# Patient Record
Sex: Female | Born: 1940 | Race: White | Hispanic: No | Marital: Married | Smoking: Never smoker
Health system: Southern US, Community
[De-identification: ages and names within clinical notes are randomized; demographics above are authoritative.]

## PROBLEM LIST (undated history)

## (undated) DIAGNOSIS — I10 Essential (primary) hypertension: Secondary | ICD-10-CM

## (undated) DIAGNOSIS — I454 Nonspecific intraventricular block: Secondary | ICD-10-CM

## (undated) DIAGNOSIS — G510 Bell's palsy: Secondary | ICD-10-CM

## (undated) DIAGNOSIS — G709 Myoneural disorder, unspecified: Secondary | ICD-10-CM

## (undated) DIAGNOSIS — F419 Anxiety disorder, unspecified: Secondary | ICD-10-CM

## (undated) DIAGNOSIS — E785 Hyperlipidemia, unspecified: Secondary | ICD-10-CM

## (undated) HISTORY — PX: LUMBAR FUSION: SHX111

## (undated) HISTORY — DX: Nonspecific intraventricular block: I45.4

## (undated) HISTORY — DX: Bell's palsy: G51.0

## (undated) HISTORY — DX: Hyperlipidemia, unspecified: E78.5

## (undated) HISTORY — PX: EXTERNAL EAR SURGERY: SHX627

## (undated) HISTORY — PX: TUBAL LIGATION: SHX77

## (undated) HISTORY — DX: Anxiety disorder, unspecified: F41.9

---

## 1999-04-04 ENCOUNTER — Other Ambulatory Visit: Admission: RE | Admit: 1999-04-04 | Discharge: 1999-04-04 | Payer: Self-pay | Admitting: Obstetrics and Gynecology

## 1999-09-26 ENCOUNTER — Other Ambulatory Visit: Admission: RE | Admit: 1999-09-26 | Discharge: 1999-09-26 | Payer: Self-pay | Admitting: Obstetrics and Gynecology

## 1999-12-26 ENCOUNTER — Encounter: Payer: Self-pay | Admitting: Family Medicine

## 1999-12-26 ENCOUNTER — Encounter: Admission: RE | Admit: 1999-12-26 | Discharge: 1999-12-26 | Payer: Self-pay | Admitting: Family Medicine

## 2000-01-19 ENCOUNTER — Encounter: Admission: RE | Admit: 2000-01-19 | Discharge: 2000-01-19 | Payer: Self-pay | Admitting: Family Medicine

## 2000-01-19 ENCOUNTER — Encounter: Payer: Self-pay | Admitting: Family Medicine

## 2000-10-31 ENCOUNTER — Other Ambulatory Visit: Admission: RE | Admit: 2000-10-31 | Discharge: 2000-10-31 | Payer: Self-pay | Admitting: Obstetrics and Gynecology

## 2001-01-31 ENCOUNTER — Encounter: Payer: Self-pay | Admitting: Family Medicine

## 2001-01-31 ENCOUNTER — Encounter: Admission: RE | Admit: 2001-01-31 | Discharge: 2001-01-31 | Payer: Self-pay | Admitting: Family Medicine

## 2001-11-12 ENCOUNTER — Other Ambulatory Visit: Admission: RE | Admit: 2001-11-12 | Discharge: 2001-11-12 | Payer: Self-pay | Admitting: Obstetrics and Gynecology

## 2002-02-12 ENCOUNTER — Encounter: Admission: RE | Admit: 2002-02-12 | Discharge: 2002-02-12 | Payer: Self-pay | Admitting: Family Medicine

## 2002-02-12 ENCOUNTER — Encounter: Payer: Self-pay | Admitting: Family Medicine

## 2002-04-07 ENCOUNTER — Ambulatory Visit (HOSPITAL_COMMUNITY): Admission: RE | Admit: 2002-04-07 | Discharge: 2002-04-07 | Payer: Self-pay | Admitting: *Deleted

## 2002-11-26 ENCOUNTER — Other Ambulatory Visit: Admission: RE | Admit: 2002-11-26 | Discharge: 2002-11-26 | Payer: Self-pay | Admitting: Obstetrics and Gynecology

## 2003-01-15 ENCOUNTER — Encounter: Admission: RE | Admit: 2003-01-15 | Discharge: 2003-01-15 | Payer: Self-pay | Admitting: Family Medicine

## 2003-01-15 ENCOUNTER — Encounter: Payer: Self-pay | Admitting: Family Medicine

## 2003-02-09 ENCOUNTER — Ambulatory Visit (HOSPITAL_COMMUNITY): Admission: RE | Admit: 2003-02-09 | Discharge: 2003-02-09 | Payer: Self-pay | Admitting: Orthopaedic Surgery

## 2003-02-19 ENCOUNTER — Encounter: Admission: RE | Admit: 2003-02-19 | Discharge: 2003-02-19 | Payer: Self-pay | Admitting: Family Medicine

## 2003-02-19 ENCOUNTER — Encounter: Payer: Self-pay | Admitting: Family Medicine

## 2004-04-04 ENCOUNTER — Encounter: Admission: RE | Admit: 2004-04-04 | Discharge: 2004-04-04 | Payer: Self-pay | Admitting: Family Medicine

## 2005-01-16 ENCOUNTER — Other Ambulatory Visit: Admission: RE | Admit: 2005-01-16 | Discharge: 2005-01-16 | Payer: Self-pay | Admitting: Obstetrics and Gynecology

## 2005-04-10 ENCOUNTER — Encounter: Admission: RE | Admit: 2005-04-10 | Discharge: 2005-04-10 | Payer: Self-pay | Admitting: Family Medicine

## 2006-05-03 ENCOUNTER — Encounter: Admission: RE | Admit: 2006-05-03 | Discharge: 2006-05-03 | Payer: Self-pay | Admitting: Family Medicine

## 2006-11-23 ENCOUNTER — Encounter: Admission: RE | Admit: 2006-11-23 | Discharge: 2006-11-23 | Payer: Self-pay | Admitting: Family Medicine

## 2007-05-16 ENCOUNTER — Encounter: Admission: RE | Admit: 2007-05-16 | Discharge: 2007-05-16 | Payer: Self-pay | Admitting: Family Medicine

## 2008-06-11 ENCOUNTER — Encounter (INDEPENDENT_AMBULATORY_CARE_PROVIDER_SITE_OTHER): Payer: Self-pay | Admitting: Obstetrics and Gynecology

## 2008-06-11 ENCOUNTER — Ambulatory Visit (HOSPITAL_COMMUNITY): Admission: RE | Admit: 2008-06-11 | Discharge: 2008-06-11 | Payer: Self-pay | Admitting: Obstetrics and Gynecology

## 2008-06-15 ENCOUNTER — Encounter: Admission: RE | Admit: 2008-06-15 | Discharge: 2008-06-15 | Payer: Self-pay | Admitting: Family Medicine

## 2009-06-21 ENCOUNTER — Encounter: Admission: RE | Admit: 2009-06-21 | Discharge: 2009-06-21 | Payer: Self-pay | Admitting: Family Medicine

## 2009-06-25 ENCOUNTER — Encounter: Admission: RE | Admit: 2009-06-25 | Discharge: 2009-06-25 | Payer: Self-pay | Admitting: Family Medicine

## 2010-07-08 ENCOUNTER — Encounter: Admission: RE | Admit: 2010-07-08 | Discharge: 2010-07-08 | Payer: Self-pay | Admitting: Family Medicine

## 2010-10-09 ENCOUNTER — Encounter: Payer: Self-pay | Admitting: Family Medicine

## 2011-01-31 NOTE — Op Note (Signed)
[name], Carol Bolton NO.:  [id number]   MEDICAL RECORD NO.:  [id number]          PATIENT TYPE:  AMB   LOCATION:  SDC                           FACILITY:  [facility]   PHYSICIAN:  Zenaida Niece, M.D.DATE OF BIRTH:  [birth year]   DATE OF PROCEDURE:  06/11/2008  DATE OF DISCHARGE:                               OPERATIVE REPORT   PREOPERATIVE DIAGNOSIS:  Vaginal nodule.   POSTOPERATIVE DIAGNOSIS:  Vaginal nodule.   PROCEDURE:  Removal of vaginal nodule.   SURGEON:  Zenaida Niece, MD   ANESTHESIA:  General with an LMA and local block.   FINDINGS:  She had a small vaginal nodule in the left vaginal fornix.   SPECIMENS:  Vaginal nodule sent to pathology.   ESTIMATED BLOOD LOSS:  Minimal.   COMPLICATIONS:  None.   PROCEDURE IN DETAIL:  The patient was taken to the operating room and  placed in the dorsal supine position.  General anesthesia was induced  and she was placed in mobile stirrups.  Perineum and vagina were then  prepped and draped in the usual sterile fashion and bladder drained with  a latex-free catheter.  Retractors were used anteriorly and posteriorly  and the vaginal nodule was palpated and then visualized in the left  vaginal fornix.  The nodule was grasped with an Allis clamp.  The  underlying vaginal mucosa was infiltrated with 5 mL of 2% plain  lidocaine.  The nodule was then removed sharply.  The resulting mucosal  defect was then closed with running locking 3-0 Vicryl with adequate  closure and adequate hemostasis.  Exam revealed no further nodules and  good removal of this nodule.  All instruments were then removed from the  vagina.  The patient was taken down from stirrups.  She was awakened in  the operating room and taken to the recovery room in stable condition.  Counts were correct x2.      Zenaida Niece, M.D.  Electronically Signed     TDM/MEDQ  D:  06/11/2008  T:  06/11/2008  Job:  [id number]

## 2011-05-30 ENCOUNTER — Other Ambulatory Visit: Payer: Self-pay | Admitting: Otolaryngology

## 2011-05-30 DIAGNOSIS — R05 Cough: Secondary | ICD-10-CM

## 2011-06-09 ENCOUNTER — Ambulatory Visit
Admission: RE | Admit: 2011-06-09 | Discharge: 2011-06-09 | Disposition: A | Discharge status: Home / Self Care | Payer: Medicare Other | Source: Ambulatory Visit | Attending: Otolaryngology | Admitting: Otolaryngology

## 2011-06-09 DIAGNOSIS — R05 Cough: Secondary | ICD-10-CM

## 2011-06-19 LAB — CBC
HCT: 42.2
Hemoglobin: 14.1
Platelets: 378
RBC: 4.81
RDW: 12.6
WBC: 7.7

## 2011-06-19 LAB — BASIC METABOLIC PANEL
Chloride: 102
GFR calc Af Amer: >60
Glucose, Bld: 113 — ABNORMAL HIGH
Sodium: 136

## 2011-06-23 ENCOUNTER — Other Ambulatory Visit: Payer: Self-pay | Admitting: Family Medicine

## 2011-06-23 DIAGNOSIS — Z1231 Encounter for screening mammogram for malignant neoplasm of breast: Secondary | ICD-10-CM

## 2011-07-17 ENCOUNTER — Ambulatory Visit
Admission: RE | Admit: 2011-07-17 | Discharge: 2011-07-17 | Disposition: A | Discharge status: Home / Self Care | Payer: Medicare Other | Source: Ambulatory Visit | Attending: Family Medicine | Admitting: Family Medicine

## 2011-07-17 DIAGNOSIS — Z1231 Encounter for screening mammogram for malignant neoplasm of breast: Secondary | ICD-10-CM

## 2011-08-16 ENCOUNTER — Emergency Department (INDEPENDENT_AMBULATORY_CARE_PROVIDER_SITE_OTHER): Payer: Medicare Other

## 2011-08-16 ENCOUNTER — Emergency Department (HOSPITAL_BASED_OUTPATIENT_CLINIC_OR_DEPARTMENT_OTHER)
Admission: EM | Admit: 2011-08-16 | Discharge: 2011-08-17 | Disposition: A | Discharge status: Home / Self Care | Payer: Medicare Other | Attending: Emergency Medicine | Admitting: Emergency Medicine

## 2011-08-16 ENCOUNTER — Encounter: Payer: Self-pay | Admitting: *Deleted

## 2011-08-16 DIAGNOSIS — M545 Low back pain: Secondary | ICD-10-CM

## 2011-08-16 DIAGNOSIS — M549 Dorsalgia, unspecified: Secondary | ICD-10-CM | POA: Insufficient documentation

## 2011-08-16 DIAGNOSIS — Z79899 Other long term (current) drug therapy: Secondary | ICD-10-CM | POA: Insufficient documentation

## 2011-08-16 DIAGNOSIS — J9819 Other pulmonary collapse: Secondary | ICD-10-CM

## 2011-08-16 DIAGNOSIS — I10 Essential (primary) hypertension: Secondary | ICD-10-CM | POA: Insufficient documentation

## 2011-08-16 DIAGNOSIS — J189 Pneumonia, unspecified organism: Secondary | ICD-10-CM | POA: Insufficient documentation

## 2011-08-16 DIAGNOSIS — R079 Chest pain, unspecified: Secondary | ICD-10-CM

## 2011-08-16 DIAGNOSIS — R002 Palpitations: Secondary | ICD-10-CM | POA: Insufficient documentation

## 2011-08-16 HISTORY — DX: Essential (primary) hypertension: I10

## 2011-08-16 LAB — URINALYSIS, ROUTINE W REFLEX MICROSCOPIC
Bilirubin Urine: NEGATIVE
Glucose, UA: NEGATIVE mg/dL
Hgb urine dipstick: NEGATIVE
Ketones, ur: 15 mg/dL — AB
Specific Gravity, Urine: 1.019 (ref 1.005–1.030)
Urobilinogen, UA: 0.2 mg/dL (ref 0.0–1.0)
pH: 5 (ref 5.0–8.0)

## 2011-08-16 LAB — COMPREHENSIVE METABOLIC PANEL
AST: 15 U/L (ref 0–37)
Albumin: 3.9 g/dL (ref 3.5–5.2)
BUN: 18 mg/dL (ref 6–23)
Calcium: 9.4 mg/dL (ref 8.4–10.5)
Creatinine, Ser: 0.7 mg/dL (ref 0.50–1.10)
GFR calc non Af Amer: 86 mL/min — ABNORMAL LOW (ref >90)
Sodium: 140 mEq/L (ref 135–145)
Total Protein: 7.3 g/dL (ref 6.0–8.3)

## 2011-08-16 LAB — D-DIMER, QUANTITATIVE: D-Dimer, Quant: <0.22 ug/mL-FEU (ref 0.00–0.48)

## 2011-08-16 MED ORDER — DEXTROSE 5 % IV SOLN
1.0000 g | Freq: Once | INTRAVENOUS | Status: AC
  Administered 2011-08-16: 1 g via INTRAVENOUS
  Filled 2011-08-16: qty 10

## 2011-08-16 MED ORDER — HYDROCOD POLST-CHLORPHEN POLST 10-8 MG/5ML PO LQCR
5.0000 mL | Freq: Once | ORAL | Status: AC
  Administered 2011-08-16: 5 mL via ORAL
  Filled 2011-08-16: qty 5

## 2011-08-16 MED ORDER — HYDROCOD POLST-CHLORPHEN POLST 10-8 MG/5ML PO LQCR: 5.0000 mL | Freq: Two times a day (BID) | ORAL | Status: DC

## 2011-08-16 MED ORDER — MOXIFLOXACIN HCL 400 MG PO TABS: 400.0000 mg | ORAL_TABLET | Freq: Every day | ORAL | Status: AC

## 2011-08-16 MED ORDER — SODIUM CHLORIDE 0.9 % IV BOLUS (SEPSIS)
1000.0000 mL | Freq: Once | INTRAVENOUS | Status: AC
  Administered 2011-08-16: 1000 mL via INTRAVENOUS

## 2011-08-16 NOTE — ED Notes (Signed)
Dr Ignacia Palma speaking about test results and f/u care.

## 2011-08-16 NOTE — ED Notes (Signed)
Pt sts she began having low back burning sensation and heart palpitations this afternoon while watching her grandkids. Pt is due to have sinus surgery in the a.m.

## 2011-08-16 NOTE — ED Notes (Signed)
Dr. Davidson at bedside.

## 2011-08-16 NOTE — ED Provider Notes (Signed)
History     CSN: [id number] Arrival date & time: 08/16/2011  9:12 PM   None     Chief Complaint  Patient presents with  . Palpitations    (Consider location/radiation/quality/duration/timing/severity/associated sxs/prior treatment) HPI Comments: Patient is a 70 year old woman who is taking care of her grandchildren this afternoon. Around 4:30 in the afternoon she developed a fluttery feeling in her chest. It was a tightness in her chest in the area of bra line. Her legs felt weak. She's had pain in her back in the past and took some Advil, which didn't seem to help. She has noted a cough recently. She has chronic sinusitis, and is supposed to have sinus surgery tomorrow.  Patient is a 70 y.o. female presenting with palpitations.  Palpitations  This is a new problem. The current episode started 3 to 5 hours ago. The problem occurs constantly. The problem has not changed since onset.The problem is associated with an unknown factor. Episode Length: Fluttery feeling in her chest as a continuous sensation. Associated symptoms include back pain, cough and sputum production. Pertinent negatives include no fever. Chest pain: she notes some chest tightness. Treatments tried: She took Advil without relief.    Past Medical History  Diagnosis Date  . Hypertension     Past Surgical History  Procedure Date  . External ear surgery     No family history on file.  History  Substance Use Topics  . Smoking status: Never Smoker   . Smokeless tobacco: Not on file  . Alcohol Use: No    OB History    Grav Para Term Preterm Abortions TAB SAB Ect Mult Living                  Review of Systems  Constitutional: Negative.  Negative for fever.  HENT: Positive for congestion and rhinorrhea.   Eyes: Negative.   Respiratory: Positive for cough and sputum production.   Cardiovascular: Positive for palpitations. Chest pain: she notes some chest tightness.  Gastrointestinal: Negative.     Genitourinary: Negative.   Musculoskeletal: Positive for back pain.  Neurological: Negative.   Psychiatric/Behavioral: Negative.     Allergies  Review of patient's allergies indicates no known allergies.  Home Medications   Current Outpatient Rx  Name Route Sig Dispense Refill  . LOSARTAN POTASSIUM 100 MG PO TABS Oral Take 125 mg by mouth daily.      Marland Kitchen METOPROLOL TARTRATE 50 MG PO TABS Oral Take 50 mg by mouth 2 (two) times daily.      Marland Kitchen HYDROCOD POLST-CHLORPHEN POLST 10-8 MG/5ML PO LQCR Oral Take 5 mLs by mouth every 12 (twelve) hours. 60 mL 0  . MOXIFLOXACIN HCL 400 MG PO TABS Oral Take 1 tablet (400 mg total) by mouth daily. 7 tablet 0    BP 167/75  Pulse 118  Temp(Src) 98.8 F (37.1 C) (Oral)  Resp 18  Ht 5\' 3"  (1.6 m)  Wt 145 lb (65.772 kg)  BMI 25.69 kg/m2  SpO2 97%  Physical Exam  Constitutional: She is oriented to person, place, and time. She appears well-developed and well-nourished.       Her face appears somewhat flushed. She is a healthy-appearing woman in no distress.  HENT:  Head: Normocephalic and atraumatic.  Right Ear: External ear normal.  Left Ear: External ear normal.  Mouth/Throat: Oropharynx is clear and moist.       Her nasal mucosa is boggy and somewhat swollen.  Eyes: Conjunctivae and EOM are normal. Pupils  are equal, round, and reactive to light. No scleral icterus.  Neck: Normal range of motion. Neck supple. No thyromegaly present.  Cardiovascular: Regular rhythm and normal heart sounds.        He has a resting tachycardia of about 120.  Pulmonary/Chest: Effort normal and breath sounds normal.  Abdominal: Soft. Bowel sounds are normal.  Musculoskeletal: Normal range of motion.       No calf tenderness, no Homans sign.  Lymphadenopathy:    She has no cervical adenopathy.  Neurological: She is alert and oriented to person, place, and time.       No sensory or motor deficit.  Skin: Skin is warm and dry.  Psychiatric: She has a normal mood  and affect. Her behavior is normal.    ED Course  Procedures (including critical care time)  Labs Reviewed  CBC - Abnormal; Notable for the following:    WBC 17.8 (*)    All other components within normal limits  COMPREHENSIVE METABOLIC PANEL - Abnormal; Notable for the following:    Glucose, Bld 136 (*)    GFR calc non Af Amer 86 (*)    All other components within normal limits  CARDIAC PANEL(CRET KIN+CKTOT+MB+TROPI) - Abnormal; Notable for the following:    Total CK 312 (*)    All other components within normal limits  URINALYSIS, ROUTINE W REFLEX MICROSCOPIC - Abnormal; Notable for the following:    Ketones, ur 15 (*)    All other components within normal limits  D-DIMER, QUANTITATIVE  URINE CULTURE  TSH   Dg Chest 2 View  08/16/2011  *RADIOLOGY REPORT*  Clinical Data: Bilateral lower back burning sensation, radiating to the chest; heart palpitations.  CHEST - 2 VIEW  Comparison: None.  Findings: The lungs are well-aerated.  Mild focal airspace opacification within the left lingula raises concern for pneumonia. Bibasilar linear atelectasis is seen.  There is no evidence of pleural effusion or pneumothorax.  The heart is normal in size; the mediastinal contour is within normal limits.  No acute osseous abnormalities are seen.  IMPRESSION: Focal airspace opacification within the left lingula raises concern for pneumonia.  Bibasilar linear atelectasis seen.  Original Report Authenticated By: Tonia Ghent, M.D.    11:52 PM  Date: 08/16/2011  Rate: 117  Rhythm: sinus tachycardia  QRS Axis: normal  Intervals: normal PQRS:  Left atrial hypertrophy; Right bundle branch block.  ST/T Wave abnormalities: normal  Conduction Disutrbances:none  Narrative Interpretation: Abnormal EKG.  Old EKG Reviewed: changes noted--rate is more rapid than on 06/10/2008.  11:52 PM Patient was seen and had physical examination. Laboratory tests were ordered. IV fluids were ordered.  11:52 PM Lab  workup showed a white count of 17,000 a left lingular pneumonia. The patient had been on Avelox recently, and feels that adding affect is better for her than most. We will give her a dose of Rocephin 1 g intravenously and place her on Avelox once a day. She will need to cancel her scheduled sinus surgery for tomorrow. She should follow up with her family physician, Cam Hai, M.D.    IMP:  Community-acquired pneumonia.       Carleene Cooper III, MD 08/16/11 [phone]

## 2011-08-18 LAB — URINE CULTURE: Colony Count: 30000

## 2011-09-18 ENCOUNTER — Ambulatory Visit
Admission: RE | Admit: 2011-09-18 | Discharge: 2011-09-18 | Disposition: A | Discharge status: Home / Self Care | Payer: Medicare Other | Source: Ambulatory Visit | Attending: Family Medicine | Admitting: Family Medicine

## 2011-09-18 ENCOUNTER — Other Ambulatory Visit: Payer: Self-pay | Admitting: Family Medicine

## 2011-09-18 DIAGNOSIS — J189 Pneumonia, unspecified organism: Secondary | ICD-10-CM

## 2011-10-09 ENCOUNTER — Ambulatory Visit
Admission: RE | Admit: 2011-10-09 | Discharge: 2011-10-09 | Disposition: A | Discharge status: Home / Self Care | Payer: Medicare Other | Source: Ambulatory Visit | Attending: Family Medicine | Admitting: Family Medicine

## 2011-10-09 ENCOUNTER — Other Ambulatory Visit: Payer: Self-pay | Admitting: Family Medicine

## 2011-10-09 DIAGNOSIS — J189 Pneumonia, unspecified organism: Secondary | ICD-10-CM

## 2011-10-10 ENCOUNTER — Ambulatory Visit
Admission: RE | Admit: 2011-10-10 | Discharge: 2011-10-10 | Disposition: A | Discharge status: Home / Self Care | Payer: Medicare Other | Source: Ambulatory Visit | Attending: Family Medicine | Admitting: Family Medicine

## 2011-10-10 ENCOUNTER — Other Ambulatory Visit: Payer: Self-pay | Admitting: Family Medicine

## 2011-10-10 DIAGNOSIS — J189 Pneumonia, unspecified organism: Secondary | ICD-10-CM

## 2011-10-10 MED ORDER — IOHEXOL 300 MG/ML  SOLN
75.0000 mL | Freq: Once | INTRAMUSCULAR | Status: AC | PRN
  Administered 2011-10-10: 75 mL via INTRAVENOUS

## 2011-11-13 ENCOUNTER — Institutional Professional Consult (permissible substitution): Payer: Medicare Other | Admitting: Pulmonary Disease

## 2011-11-14 ENCOUNTER — Encounter: Payer: Self-pay | Admitting: Pulmonary Disease

## 2011-11-14 DIAGNOSIS — E119 Type 2 diabetes mellitus without complications: Secondary | ICD-10-CM | POA: Insufficient documentation

## 2011-11-14 DIAGNOSIS — E785 Hyperlipidemia, unspecified: Secondary | ICD-10-CM | POA: Insufficient documentation

## 2011-11-14 DIAGNOSIS — I10 Essential (primary) hypertension: Secondary | ICD-10-CM | POA: Insufficient documentation

## 2011-11-14 DIAGNOSIS — F419 Anxiety disorder, unspecified: Secondary | ICD-10-CM | POA: Insufficient documentation

## 2011-11-14 DIAGNOSIS — I454 Nonspecific intraventricular block: Secondary | ICD-10-CM

## 2011-11-14 HISTORY — DX: Type 2 diabetes mellitus without complications: E11.9

## 2011-11-15 ENCOUNTER — Ambulatory Visit (INDEPENDENT_AMBULATORY_CARE_PROVIDER_SITE_OTHER): Payer: Medicare Other | Admitting: Pulmonary Disease

## 2011-11-15 ENCOUNTER — Encounter: Payer: Self-pay | Admitting: Pulmonary Disease

## 2011-11-15 DIAGNOSIS — J479 Bronchiectasis, uncomplicated: Secondary | ICD-10-CM | POA: Insufficient documentation

## 2011-11-15 DIAGNOSIS — R918 Other nonspecific abnormal finding of lung field: Secondary | ICD-10-CM

## 2011-11-15 DIAGNOSIS — J329 Chronic sinusitis, unspecified: Secondary | ICD-10-CM | POA: Insufficient documentation

## 2011-11-15 NOTE — Progress Notes (Signed)
  Subjective:    Patient ID: Carol Bolton, female    DOB: Jul 27, 1941, 71 y.o.   MRN: [id number]  HPI The patient is a 71 year old female who been asked to see for recurrent sinopulmonary infections.  The patient states that she began to have issues with recurrent infections in December of 2011, and ultimately was diagnosed with chronic sinusitis by CT chest.  She was scheduled to have surgery, but this was canceled because of development of pneumonia in November of last year.  At that time, she was having cough with purulent mucus, but denied any chest congestion or fever.  A chest x-ray showed a lingular and lower lobe infiltrates.  The patient was treated with antibiotics, and she feels better each time that she takes them.  However, her chest x-ray has never cleared.  Since November of last year, she has had a recurrence of her worsening cough and purulent mucus that has required multiple rounds of antibiotics.  However, she denies having any pulmonary symptoms such as increased shortness of breath, true wheezing, or chest congestion.  Her cough is primarily from her upper airway, and she notes significant postnasal drip.  I have looked at her prior cxr's, and unfortunately she does not have any in the system before November of 2012.  She has had a recent CT chest which shows inflammatory changes and possibly infiltrate in the lingula, right lower lobe, and left lower lobe.  However, she appears to have dilated bronchi in these areas that are suggestive of bronchiectasis.   Review of Systems  Constitutional: Positive for unexpected weight change. Negative for fever.  HENT: Positive for ear pain and congestion. Negative for nosebleeds, sore throat, rhinorrhea, sneezing, trouble swallowing, dental problem, postnasal drip and sinus pressure.   Eyes: Negative for redness and itching.  Respiratory: Negative for chest tightness, shortness of breath and wheezing.   Cardiovascular: Negative for palpitations  and leg swelling.  Gastrointestinal: Negative for nausea and vomiting.  Genitourinary: Negative for dysuria.  Musculoskeletal: Negative for joint swelling.  Skin: Negative for rash.  Neurological: Negative for headaches.  Hematological: Does not bruise/bleed easily.  Psychiatric/Behavioral: Negative for dysphoric mood. The patient is not nervous/anxious.        Objective:   Physical Exam Constitutional:  Well developed, no acute distress  HENT:  Nares patent without discharge, erythematous mucosa  Oropharynx without exudate, palate and uvula are normal  +facial droop  Eyes:  Perrla, eomi, no scleral icterus  Neck:  No JVD, no TMG  Cardiovascular:  Normal rate, regular rhythm, no rubs or gallops.  No murmurs        Intact distal pulses  Pulmonary :  Normal breath sounds, no stridor or respiratory distress   No rales, rhonchi, or wheezing  Abdominal:  Soft, nondistended, bowel sounds present.  No tenderness noted.   Musculoskeletal:  No lower extremity edema noted.  Lymph Nodes:  No cervical lymphadenopathy noted  Skin:  No cyanosis noted  Neurologic:  Alert, appropriate, moves all 4 extremities without obvious deficit.         Assessment & Plan:

## 2011-11-15 NOTE — Patient Instructions (Signed)
Would call Dr. Allene Pyo office and get sinus surgery rescheduled asap Would like to see you back about 4-6 weeks after the surgery to check on your symptoms.

## 2011-11-15 NOTE — Assessment & Plan Note (Signed)
The patient has persistent abnormalities on a recent CT chest, but I am thinking that these are more inflammatory than anything else.  I am suspicious she has underlying bronchiectasis that may be contributing to the slow clearing.  Unfortunately, she has no old chest x-ray before November of 2012 in the system, and I wonder if her current changes are chronic and would be found on older films as well.

## 2011-11-15 NOTE — Assessment & Plan Note (Signed)
The patient is having recurrent infections off antibiotics that are due to her chronic sinusitis until proven otherwise.  I think she must have her surgery as soon as possible to alleviate this cycle, and would then follow her closely thereafter.  She really is not having any pulmonary symptoms, and most of her issues are coming from the upper airway despite the findings on CT chest.

## 2011-12-07 ENCOUNTER — Other Ambulatory Visit: Payer: Self-pay | Admitting: Otolaryngology

## 2011-12-25 ENCOUNTER — Ambulatory Visit: Payer: Medicare Other | Admitting: Pulmonary Disease

## 2012-01-08 ENCOUNTER — Encounter: Payer: Self-pay | Admitting: Pulmonary Disease

## 2012-01-08 ENCOUNTER — Ambulatory Visit (INDEPENDENT_AMBULATORY_CARE_PROVIDER_SITE_OTHER): Payer: Medicare Other | Admitting: Pulmonary Disease

## 2012-01-08 DIAGNOSIS — J329 Chronic sinusitis, unspecified: Secondary | ICD-10-CM

## 2012-01-08 DIAGNOSIS — J479 Bronchiectasis, uncomplicated: Secondary | ICD-10-CM

## 2012-01-08 NOTE — Assessment & Plan Note (Signed)
It is unclear whether she is going to have recurring pulmonary issues since she has had her sinus surgery, but she is to call me if her cough doesn't totally resolve or if she develops increasing pulmonary symptoms.  At that point, I would culture her lower respiratory tract to establish her colonizing flora, and also do PFTs to evaluate for airflow obstruction which is common in bronchiectasis patients.

## 2012-01-08 NOTE — Progress Notes (Signed)
  Subjective:    Patient ID: Carol Bolton, female    DOB: Aug 27, 1941, 71 y.o.   MRN: [id number]  HPI The patient comes in today for followup of her known abnormal CT chest and recurrent pulmonary symptoms.  She has known chronic sinusitis, and did recently have her surgery.  Since that time, her cough has significantly improved, and continues to do so.  She has not had any chest congestion or mucus from her chest.  She denies any shortness of breath.   Review of Systems  Constitutional: Negative for fever and unexpected weight change.  HENT: Positive for congestion. Negative for ear pain, nosebleeds, sore throat, rhinorrhea, sneezing, trouble swallowing, dental problem, postnasal drip and sinus pressure.   Eyes: Negative for redness and itching.  Respiratory: Positive for cough. Negative for chest tightness, shortness of breath and wheezing.   Cardiovascular: Negative for palpitations and leg swelling.  Gastrointestinal: Negative for nausea and vomiting.  Genitourinary: Negative for dysuria.  Musculoskeletal: Negative for joint swelling.  Skin: Negative for rash.  Neurological: Negative for headaches.  Hematological: Does not bruise/bleed easily.  Psychiatric/Behavioral: Negative for dysphoric mood. The patient is not nervous/anxious.        Objective:   Physical Exam Well-developed female in no acute distress Nose without purulence or discharge noted Chest totally clear to auscultation Cardiac exam with regular rate and rhythm Lower extremities without edema, no cyanosis       Assessment & Plan:

## 2012-01-08 NOTE — Patient Instructions (Signed)
Continue nasal hygiene If your cough persists despite clear sinuses, or if your breathing/congestion becomes a problem, please call for an apptm.

## 2012-01-08 NOTE — Assessment & Plan Note (Signed)
The patient states she is much improved since her surgery for her chronic sinusitis.  Her cough has nearly resolved.

## 2012-03-13 ENCOUNTER — Ambulatory Visit (INDEPENDENT_AMBULATORY_CARE_PROVIDER_SITE_OTHER)
Admission: RE | Admit: 2012-03-13 | Discharge: 2012-03-13 | Disposition: A | Discharge status: Home / Self Care | Payer: Medicare Other | Source: Ambulatory Visit | Attending: Pulmonary Disease | Admitting: Pulmonary Disease

## 2012-03-13 ENCOUNTER — Encounter: Payer: Self-pay | Admitting: Pulmonary Disease

## 2012-03-13 ENCOUNTER — Ambulatory Visit (INDEPENDENT_AMBULATORY_CARE_PROVIDER_SITE_OTHER): Payer: Medicare Other | Admitting: Pulmonary Disease

## 2012-03-13 VITALS — BP 156/88 | HR 81 | Temp 98.0°F | Ht 62.5 in | Wt 132.0 lb

## 2012-03-13 DIAGNOSIS — J479 Bronchiectasis, uncomplicated: Secondary | ICD-10-CM

## 2012-03-13 DIAGNOSIS — R059 Cough, unspecified: Secondary | ICD-10-CM

## 2012-03-13 DIAGNOSIS — R05 Cough: Secondary | ICD-10-CM | POA: Insufficient documentation

## 2012-03-13 HISTORY — DX: Cough, unspecified: R05.9

## 2012-03-13 MED ORDER — HYDROCODONE-HOMATROPINE 5-1.5 MG/5ML PO SYRP: ORAL_SOLUTION | ORAL | Status: DC

## 2012-03-13 NOTE — Progress Notes (Signed)
  Subjective:    Patient ID: Carol Bolton, female    DOB: Dec 10, 1940, 71 y.o.   MRN: [id number]  HPI Patient comes in today for an acute sick visit.  She was last seen for chronic cough, and found to have chronic sinusitis as well as bronchiectasis.  She underwent sinus surgery, with significant improvement in her cough.  Since that time, she has had a recurrence of her cough, and has recently seen her otolaryngologist who felt her sinuses were clear.  He did not reimage her sinuses.  She denies chest congestion or shortness of breath, but does have severe postnasal drip leading to her cough.  Her ENT doctor has treated her with multiple nasal sprays without success.  Of note, the patient states that she had no pulmonary symptoms when she was diagnosed with "pneumonia" in the past.   Review of Systems  Constitutional: Negative for fever and unexpected weight change.  HENT: Positive for congestion, rhinorrhea and postnasal drip. Negative for ear pain, nosebleeds, sore throat, sneezing, trouble swallowing and dental problem.   Eyes: Negative for redness and itching.  Respiratory: Positive for cough. Negative for chest tightness, shortness of breath and wheezing.   Cardiovascular: Negative for palpitations and leg swelling.  Gastrointestinal: Negative for nausea and vomiting.  Genitourinary: Negative for dysuria.  Musculoskeletal: Negative for joint swelling.  Skin: Negative for rash.  Neurological: Negative for headaches.  Hematological: Does not bruise/bleed easily.  Psychiatric/Behavioral: Negative for dysphoric mood. The patient is not nervous/anxious.        Objective:   Physical Exam Well-developed female in no acute distress Nose without purulence or discharge noted Oropharynx clear Chest with a squeak and slight crackle in the right base, otherwise clear Cardiac exam with regular rate and rhythm Lower extremities without edema, no cyanosis Alert and oriented, moves all 4  extremities.       Assessment & Plan:

## 2012-03-13 NOTE — Patient Instructions (Addendum)
Will check cxr today, and call you with results. Will need to check a sputum culture to identify your colonizing organisms with your bronchiectasis. Chlorpheniramine 4mg   Take 2 at bedtime and one at lunch each day.  Stop zyrtec and benedryl. Hycodan one teaspoon at bedtime if needed for cough.  Can take mucinex during the day. Please call in 2 weeks (before 7/10) with how things are going with new antihistamine.

## 2012-03-13 NOTE — Assessment & Plan Note (Addendum)
The patient's cough had totally resolved after her sinus surgery, but now has returned.  She continues to have nasal symptoms and postnasal drip, but apparently has been told that her sinuses were clear by otolaryngology.  She really is not having pulmonary symptoms, but is concerned he posed her prior episodes of infection were not associated with definite pulmonary symptoms.  Her spirometry today is totally normal, but we'll check a chest x-ray for completeness.  I also would like to culture her mucus with her underlying bronchiectasis to make sure she does not have a resistant pathogen or possibly MAC.  Will treat her with a cough suppressant until we can sort this out.  Would also like to treat with a more aggressive antihistamine to see if we can improve the postnasal drip.

## 2012-03-13 NOTE — Assessment & Plan Note (Signed)
The patient has bronchiectasis on prior scans, but it is unclear whether any of her current symptoms are associated with this.  Her description of cough and associated symptoms are most consistent with an upper airway cough.

## 2012-03-15 ENCOUNTER — Other Ambulatory Visit: Payer: Medicare Other

## 2012-03-15 ENCOUNTER — Telehealth: Payer: Self-pay | Admitting: Pulmonary Disease

## 2012-03-15 DIAGNOSIS — J479 Bronchiectasis, uncomplicated: Secondary | ICD-10-CM

## 2012-03-15 NOTE — Telephone Encounter (Signed)
Notes Recorded by Barbaraann Share, MD on 03/13/2012 at 6:17 PM Please let pt know that her cxr shows no pna.  ----  Called, spoke with pt.  I informed her CXR shows no pna per Dr. Shelle Iron.  She verbalized understanding of this.  Also, pt states Chlorpheniramine was not sent to pharmacy.  Advised this is OTC.  Pt verbalized understanding of this and voiced no further questions/concerns at this time.

## 2012-03-15 NOTE — Progress Notes (Signed)
Quick Note:  Spoke with pt. Advised cxr shows no pna per Dr. Shelle Iron. She verbalized understanding of this. ______

## 2012-03-22 ENCOUNTER — Telehealth: Payer: Self-pay | Admitting: Pulmonary Disease

## 2012-03-22 NOTE — Telephone Encounter (Signed)
Notes Recorded by Barbaraann Share, MD on 03/19/2012 at 1:50 PM Please let pt know that her culture did not grow any unusual bugs. Good news! Let us know early next week (before 7/10), how things are going with her cough   I spoke with patient about results and she verbalized understanding and had no questions

## 2012-03-27 ENCOUNTER — Telehealth: Payer: Self-pay | Admitting: Pulmonary Disease

## 2012-03-27 DIAGNOSIS — J329 Chronic sinusitis, unspecified: Secondary | ICD-10-CM

## 2012-03-27 DIAGNOSIS — R05 Cough: Secondary | ICD-10-CM

## 2012-03-27 NOTE — Telephone Encounter (Signed)
Pt was seen on 03/13/12 and was advised:  Patient Instructions     Will check cxr today, and call you with results.  Will need to check a sputum culture to identify your colonizing organisms with your bronchiectasis.  Chlorpheniramine 4mg  Take 2 at bedtime and one at lunch each day. Stop zyrtec and benedryl.  Hycodan one teaspoon at bedtime if needed for cough. Can take mucinex during the day.  Please call in 2 weeks (before 7/10) with how things are going with new antihistamine.     ---  Called, spoke with pt who is calling to give [facility] update on cough.  Reports this is unchanged - still coughing - prod at times with pale yellow mucus, having congestion in throat, and runny nose.  States she is following all recs above with the chlorpheniramine, hycodan, and mucinex during the day.  She is able to sleep at bedtime when taking 2 chlorpheniramine and hycodan.  She would like to know what the next step is.  She is aware [facility] if off until next Thursday and ok with.  Dr. Shelle Iron, pls advise. Thank you.

## 2012-03-28 LAB — RESPIRATORY CULTURE OR RESPIRATORY AND SPUTUM CULTURE: Gram Stain: NONE SEEN

## 2012-04-04 NOTE — Telephone Encounter (Signed)
Let her know that I am still concerned about her sinuses as the cause of her cough.  I think we should re-image her sinuses and put this issue to rest.  If her sinuses are clean, then it will become even more important to get her sputum cultured to help with antibiotic selection.  Lets start with sinus scan.  Let me know if she is agreeable.

## 2012-04-05 NOTE — Telephone Encounter (Signed)
Spoke with patient-agrees to have CT sinus done. Please advise that DX code to use for this.    Pt would like to be called on her cell phone with appt time and date for CT. Cell: [phone]

## 2012-04-05 NOTE — Telephone Encounter (Signed)
Do the limited ct sinuses. Code:  Chronic cough, chronic sinus disease.

## 2012-04-05 NOTE — Telephone Encounter (Signed)
LMTCB

## 2012-04-10 ENCOUNTER — Ambulatory Visit (INDEPENDENT_AMBULATORY_CARE_PROVIDER_SITE_OTHER)
Admission: RE | Admit: 2012-04-10 | Discharge: 2012-04-10 | Disposition: A | Discharge status: Home / Self Care | Payer: Medicare Other | Source: Ambulatory Visit | Attending: Pulmonary Disease | Admitting: Pulmonary Disease

## 2012-04-10 DIAGNOSIS — R05 Cough: Secondary | ICD-10-CM

## 2012-04-10 DIAGNOSIS — J329 Chronic sinusitis, unspecified: Secondary | ICD-10-CM

## 2012-04-18 ENCOUNTER — Telehealth: Payer: Self-pay | Admitting: Pulmonary Disease

## 2012-04-18 NOTE — Telephone Encounter (Signed)
Called and spoke with patient.  Informed her of results per Dr. Shelle Iron as listed below. Patient verbalized understanding.  Stated she would call ENT and make appt now and once seen by ENT would give Dr. Shelle Iron a call back.  Nothing further needed at this time.  Notes Recorded by Barbaraann Share, MD on 04/17/2012 at 5:00 PM Let pt know that I just got back in town and reviewed her sinus ct. She has diffuse inflammation in all of her sinuses, but it is unclear how much of this is due to infection vs postop changes. Because her scan is far from normal, I would like for her to see ENT again and get them to review the scan for their opinion. Her cough went away when her sinuses were treated with surgery last go around, and it stands to reason this is the cause again given her scan of sinuses. . Let me know what is decided after seeing ENT.

## 2012-04-27 LAB — AFB CULTURE WITH SMEAR (NOT AT ARMC): Acid Fast Smear: NONE SEEN

## 2012-06-05 ENCOUNTER — Telehealth: Payer: Self-pay | Admitting: Pulmonary Disease

## 2012-06-05 NOTE — Telephone Encounter (Signed)
lmomtcb x1--lori looked and id not have any fax on pt

## 2012-06-06 ENCOUNTER — Encounter: Payer: Self-pay | Admitting: Pulmonary Disease

## 2012-06-06 NOTE — Telephone Encounter (Signed)
lmomtcb x2 for pt 

## 2012-06-06 NOTE — Telephone Encounter (Signed)
Pt returned call. Carol Bolton  

## 2012-06-06 NOTE — Telephone Encounter (Signed)
Carol Bolton, they were supposed to have faxed a form, have you seen this? Please advise thanks!!

## 2012-06-06 NOTE — Telephone Encounter (Signed)
Form received and has been placed in [facility] look at folder.

## 2012-06-06 NOTE — Telephone Encounter (Signed)
The pt has normal breathing studies and no significant risk for surgery. If she needs a form filled out, will need to have ortho send over.

## 2012-06-06 NOTE — Telephone Encounter (Signed)
I spoke with pt and she stated she is going to have back surgery. She has a cysts that needs to be removed. Dr. Darden Amber from [facility] ortho will be doing the surgery. It is not scheduled yet. She is needing clearance from Dr. Shelle Iron for this first. Does pt need to come in for OV. Please advise Dr. Shelle Iron thanks

## 2012-06-10 NOTE — Telephone Encounter (Signed)
Pt called back re: same. Carol Bolton °

## 2012-06-11 NOTE — Telephone Encounter (Signed)
Pt aware form has been completed and faxed.

## 2012-06-11 NOTE — Telephone Encounter (Signed)
LMOMTCB x1 for the pt. Surgical clearance form filled out by [facility] and has been faxed back to ortho.

## 2012-06-24 DIAGNOSIS — M7138 Other bursal cyst, other site: Secondary | ICD-10-CM | POA: Diagnosis present

## 2012-06-24 HISTORY — DX: Other bursal cyst, other site: M71.38

## 2012-06-24 NOTE — H&P (Signed)
Carol Bolton 06/24/2012 11:29 AM Location: [facility] Patient #: [id number] DOB: 11/10/1940 Unknown / Language: [place] / Race: White Female   History of Present Illness(Lori Zipporah Plants; 06/24/2012 11:30 AM) The patient is a 71 year old female who comes in today for a preoperative History and Physical. The patient is scheduled for a TLIF L5-S1 to be performed by Dr. Debria Garret D. Shon Baton, MD at [facility] on [phone] @730am  .    Allergies(Lori W Randa Lynn; 06/24/2012 11:30 AM) No Known Drug Allergies. 11/07/2011   Family History(Lynessa Almanzar J Asaf Elmquist, PA-C; 06/29/2012 9:38 AM) Cerebrovascular Accident. grandfather fathers side Heart Disease. grandmother mothers side Hypertension. father   Social History(Alethia Melendrez J [facility], PA-C; 06/29/2012 9:38 AM) Pain Contract. no Number of flights of stairs before winded. greater than 5, 4-5 Marital status. married Exercise. Exercises weekly; does running / walking Alcohol use. never consumed alcohol Drug/Alcohol Rehab (Previously). no Drug/Alcohol Rehab (Currently). no Tobacco use. never smoker Children. 2 Current work status. retired Illicit drug use. no Living situation. live with spouse   Medication History(Lori W Randa Lynn; 06/24/2012 11:32 AM) Losartan Potassium-HCTZ (100-25MG  Tablet, 1 Oral every morning) Active. Simvastatin (40MG  Tablet, 1 Oral at bedtime) Active. Metoprolol Tartrate (50MG  Tablet, 1 Oral two times daily) Active. Aspirin (81MG  Tablet, 1 Oral every morning) Active. Fish Oil Concentrate (435MG  Capsule, 1 Oral every morning) Active. Vitamin D (1 Oral) Specific dose unknown - Active. Vitamin C (1 Oral) Specific dose unknown - Active.   Pregnancy / Birth History(Lori Zipporah Plants; 06/24/2012 11:33 AM) Pregnant. no   Past Surgical History(Shenequa Howse J [facility], PA-C; 06/29/2012 9:38 AM) Tubal Ligation Sinus Surgery   Other Problems(Taeler Winning J [facility], PA-C; 06/29/2012 9:38 AM) Diabetes Mellitus, Type  II High blood pressure Hypercholesterolemia   Review of Systems(Sholanda Croson J [facility], PA-C; 06/29/2012 9:38 AM) General:Not Present- Chills, Fever, Night Sweats, Appetite Loss, Fatigue, Feeling sick, Weight Gain and Weight Loss. Skin:Not Present- Itching, Rash, Skin Color Changes, Ulcer, Psoriasis and Change in Hair or Nails. HEENT:Not Present- Sensitivity to light, Hearing problems, Nose Bleed and Ringing in the Ears. Neck:Not Present- Swollen Glands and Neck Mass. Respiratory:Not Present- Snoring, Chronic Cough, Bloody sputum and Dyspnea. Cardiovascular:Not Present- Shortness of Breath, Chest Pain, Swelling of Extremities, Leg Cramps and Palpitations. Gastrointestinal:Not Present- Bloody Stool, Heartburn, Abdominal Pain, Vomiting, Nausea and Incontinence of Stool. Female Genitourinary:Not Present- Blood in Urine, Menstrual Irregularities, Frequency, Incontinence and Nocturia. Musculoskeletal:Not Present- Muscle Weakness, Muscle Pain, Joint Stiffness, Joint Swelling, Joint Pain and Back Pain. Neurological:Present- Tingling and Numbness. Not Present- Burning, Tremor, Headaches and Dizziness. Psychiatric:Not Present- Anxiety, Depression and Memory Loss. Endocrine:Not Present- Cold Intolerance, Heat Intolerance, Excessive hunger and Excessive Thirst. Hematology:Not Present- Abnormal Bleeding, Anemia, Blood Clots and Easy Bruising.   Vitals(Lori W Lamb; 06/24/2012 11:34 AM) 06/24/2012 11:33 AM Weight: 133 lb Height: 62.5 in Body Surface Area: 1.63 m Body Mass Index: 23.94 kg/m Pulse: 62 (Regular) BP: 123/72 (Sitting, Left Arm, Standard)    Physical Exam(Mateen Franssen J Mirtie Bastyr, PA-C; 06/29/2012 9:56 AM) The physical exam findings are as follows:  Note: She is alert and oriented in NAD. She ambulates without assistance. Heart and lungs are clear to auscultation. The abdomen is soft and without rebound tenderness. PERRL. Neck is supple and without lymphadenopathy.  There is no obvious deformity of the lumbar spine. She has decreased and painful ROM. She is able to heel-walk, toe-walk and heel-toe-walk without difficulty. Sensation and peripheral pulses are intact. DTR are symmetric. Negative Babinski. No clonus. From a seated position she has slightly decreased strength on  the right compared to the left however she does have right sided radicular leg pain. No true hip, knee or ankle pain with joint ROM. Compartments are soft and nontender.   Assessment & Plan(Gloristine Turrubiates J [facility], PA-C; 06/29/2012 9:57 AM) Pain, Lumbar (LBP) (724.2)  Note: Unfortunately conservative measures consisting of observation, activity modification, injections and oral pain medications have failed to alleviate her symptoms and given the ongoing nature of her pain and the significant decrease in her overall quality of life, she wishes to proceed with surgery. Risks/benefits/alternatives to the procedure/expectations following the procedure have been reviewed with the patient by Dr. Shon Baton. She is aware that the goal of surgery is to reduce not eliminate her pain. She understands.  MRI shows a large right-sided synovial cyst displacing the right S1 and S2 roots; it has not decreased in size from previous study. There is also a smaller left L5-S1 cyst, but it is not causing significant nerve compression. Please see the MRI report for the remaining specifics.   She has been fitted for her brace and knows to bring this with her the morning of surgery. She has completed her pro-op hospital requirements. She has been medically cleared for the procedure by Dr. Clelia Croft and Dr. Shelle Iron. Please see the scanned documentation in the patients office chart.   Signed electronically by Gwinda Maine, PA-C (06/29/2012 9:57 AM)    Carol Bolton 06/03/2012 9:01 AM [facility]: [facility] Patient #: [id number] DOB: Oct 20, 1940 Unknown / Language: [place] / Race:  White Female   History of Present Illness(Lori Zipporah Plants; 06/03/2012 9:17 AM) The patient is a 71 year old female who presents today for follow up of their back. The patient is being followed for their back pain. Symptoms reported today include: pain (about the right lower lumbar region radiating into the right lower ext. to the foot ), swelling (yesterday some right ankle swelling ) and numbness (right lower ext. ), while the patient does not report symptoms of: weakness or urinary incontinence. The patient states that they are doing poorly. Current treatment includes: relative rest, activity modification and NSAIDs. The following medication has been used for pain control: antiinflammatory medication (Advil ). The patient presents today following MRI (performed at [facility] ).    Subjective Transcription(DAHARI Sheela Stack, MD; 06/06/2012 5:16 PM)  She returns today for follow up. She continues to have severe right radicular S1 nerve pain, no loss of control of bowel or bladder, no significant left leg pain.    Allergies(Lori W Lamb; 06/03/2012 9:02 AM) No Known Drug Allergies. 11/07/2011   Social History(Lori W Randa Lynn; 06/03/2012 9:02 AM) Tobacco use. never smoker   Past Surgical History(Lori W Lamb; 06/03/2012 9:02 AM) Tubal Ligation Sinus Surgery   Objective Transcription(DAHARI D BROOKS, MD; 06/06/2012 5:16 PM)  Her clinical exam is still significant for horrific back pain with radicular leg pain. The MRI shows a large right-sided synovial cyst that has not decreased in size. There is also a smaller left L5-S1 cyst, but it is not causing significant nerve compression. At this point in time, the patient has tried an aspiration and injection which were unsuccessful. In order to address this, I would need to do a complete Gill decompression on the right side to eliminate the facet joint and the cyst and thereby prevent a recurrence of the cyst. My concern is that, if I just do  the right side, the cyst on the left can become problematic and then I'd have to go back in and  address that, therefore she would need a partial facetectomy on the left and a complete facetectomy on the right. This would result in instability, therefore, in order to prevent iatrogenic instability, I would recommend, in addition to the decompression, a transforaminal lumbar interbody fusion. Through the same Gill decompression, I can put in an interbody spacer and pedicle screw fixation to increase my likelihood of solid fusion. I have discussed this with the patient including the risks and the benefits which include infection, bleeding, nerve damage, death, stroke, paralysis, failure to heal, hardware failure, nonunion, leak of spinal fluid, loss of bowel and bladder control. All of her questions were addressed.    Plans Transcription(DAHARI Sheela Stack, MD; 06/06/2012 5:16 PM)  I have given her some literature to review. She will discuss it with her family and then she will contact me this week and we will determine how best to proceed.      Miscellaneous Transcription(DAHARI Sheela Stack, MD; 06/06/2012 5:16 PM)  Venita Lick, M. D./slk    T: 06-05-12  D: 06-03-12      Signed electronically by Alvy Beal, MD (06/04/2012 5:06 PM)

## 2012-06-25 ENCOUNTER — Encounter (HOSPITAL_COMMUNITY): Payer: Self-pay | Admitting: Pharmacy Technician

## 2012-06-27 ENCOUNTER — Encounter (HOSPITAL_COMMUNITY): Payer: Self-pay

## 2012-06-27 ENCOUNTER — Encounter (HOSPITAL_COMMUNITY)
Admission: RE | Admit: 2012-06-27 | Discharge: 2012-06-27 | Disposition: A | Discharge status: Home / Self Care | Payer: Medicare Other | Source: Ambulatory Visit | Attending: Physician Assistant | Admitting: Physician Assistant

## 2012-06-27 ENCOUNTER — Encounter (HOSPITAL_COMMUNITY)
Admission: RE | Admit: 2012-06-27 | Discharge: 2012-06-27 | Disposition: A | Discharge status: Home / Self Care | Payer: Medicare Other | Source: Ambulatory Visit | Attending: Orthopedic Surgery | Admitting: Orthopedic Surgery

## 2012-06-27 HISTORY — DX: Myoneural disorder, unspecified: G70.9

## 2012-06-27 LAB — CBC
HCT: 38.6 % (ref 36.0–46.0)
Hemoglobin: 12.8 g/dL (ref 12.0–15.0)
MCH: 28.6 pg (ref 26.0–34.0)
MCHC: 33.2 g/dL (ref 30.0–36.0)

## 2012-06-27 LAB — COMPREHENSIVE METABOLIC PANEL
ALT: 13 U/L (ref 0–35)
Alkaline Phosphatase: 76 U/L (ref 39–117)
CO2: 28 mEq/L (ref 19–32)
GFR calc Af Amer: >90 mL/min (ref >90)
GFR calc non Af Amer: 84 mL/min — ABNORMAL LOW (ref >90)
Glucose, Bld: 112 mg/dL — ABNORMAL HIGH (ref 70–99)
Potassium: 4 mEq/L (ref 3.5–5.1)
Sodium: 141 mEq/L (ref 135–145)
Total Bilirubin: 0.1 mg/dL — ABNORMAL LOW (ref 0.3–1.2)

## 2012-06-27 NOTE — Pre-Procedure Instructions (Signed)
20 NOVICE VRBA  06/27/2012   Your procedure is scheduled on:  Thursday Jun 24, 2012  Report to [facility] at 0530 AM.  Call this number if you have problems the morning of surgery: [phone]   Remember:   Do not eat foodor drink liquids :After Midnight 07/03/2012.   Take these medicines the morning of surgery with A SIP OF WATER: cipro  Lopressor  claritin flonase     Do not wear jewelry, make-up or nail polish.  Do not wear lotions, powders, or perfumes. You may wear deodorant.  Do not shave 48 hours prior to surgery. Men may shave face and neck.  Do not bring valuables to the hospital.  Contacts, dentures or bridgework may not be worn into surgery.  Leave suitcase in the car. After surgery it may be brought to your room.  For patients admitted to the hospital, checkout time is 11:00 AM the day of discharge.   Patients discharged the day of surgery will not be allowed to drive home.  Name and phone number of your driver:   Special Instructions: Shower using CHG 2 nights before surgery and the night before surgery.  If you shower the day of surgery use CHG.  Use special wash - you have one bottle of CHG for all showers.  You should use approximately 1/3 of the bottle for each shower.   Please read over the following fact sheets that you were given: Pain Booklet, Coughing and Deep Breathing, Blood Transfusion Information, MRSA Information and Surgical Site Infection Prevention

## 2012-07-03 MED ORDER — CEFAZOLIN SODIUM-DEXTROSE 2-3 GM-% IV SOLR
2.0000 g | INTRAVENOUS | Status: AC
  Administered 2012-07-04: 2 g via INTRAVENOUS
  Administered 2012-07-04: 1 g via INTRAVENOUS
  Filled 2012-07-03: qty 50

## 2012-07-03 MED ORDER — LACTATED RINGERS IV SOLN: INTRAVENOUS | Status: DC

## 2012-07-04 ENCOUNTER — Encounter (HOSPITAL_COMMUNITY)
Admission: RE | Disposition: A | Discharge status: Home / Self Care | Payer: Self-pay | Source: Ambulatory Visit | Attending: Orthopedic Surgery

## 2012-07-04 ENCOUNTER — Encounter (HOSPITAL_COMMUNITY): Payer: Self-pay | Admitting: Anesthesiology

## 2012-07-04 ENCOUNTER — Observation Stay (HOSPITAL_COMMUNITY): Payer: Medicare Other

## 2012-07-04 ENCOUNTER — Encounter (HOSPITAL_COMMUNITY): Payer: Self-pay | Admitting: *Deleted

## 2012-07-04 ENCOUNTER — Inpatient Hospital Stay (HOSPITAL_COMMUNITY): Payer: Medicare Other | Admitting: Anesthesiology

## 2012-07-04 ENCOUNTER — Inpatient Hospital Stay (HOSPITAL_COMMUNITY): Payer: Medicare Other

## 2012-07-04 ENCOUNTER — Inpatient Hospital Stay (HOSPITAL_COMMUNITY)
Admission: RE | Admit: 2012-07-04 | Discharge: 2012-07-06 | DRG: 460 | Disposition: A | Discharge status: Home / Self Care | Payer: Medicare Other | Source: Ambulatory Visit | Attending: Orthopedic Surgery | Admitting: Orthopedic Surgery

## 2012-07-04 DIAGNOSIS — F411 Generalized anxiety disorder: Secondary | ICD-10-CM | POA: Diagnosis present

## 2012-07-04 DIAGNOSIS — I10 Essential (primary) hypertension: Secondary | ICD-10-CM | POA: Diagnosis present

## 2012-07-04 DIAGNOSIS — E119 Type 2 diabetes mellitus without complications: Secondary | ICD-10-CM | POA: Diagnosis present

## 2012-07-04 DIAGNOSIS — M7138 Other bursal cyst, other site: Secondary | ICD-10-CM

## 2012-07-04 DIAGNOSIS — M7130 Other bursal cyst, unspecified site: Principal | ICD-10-CM | POA: Diagnosis present

## 2012-07-04 LAB — GLUCOSE, CAPILLARY: Glucose-Capillary: 112 mg/dL — ABNORMAL HIGH (ref 70–99)

## 2012-07-04 SURGERY — POSTERIOR LUMBAR FUSION 1 LEVEL
Anesthesia: General | Site: Back | Wound class: Clean

## 2012-07-04 MED ORDER — DIPHENHYDRAMINE HCL 50 MG/ML IJ SOLN: 12.5000 mg | Freq: Four times a day (QID) | INTRAMUSCULAR | Status: DC | PRN

## 2012-07-04 MED ORDER — SODIUM CHLORIDE 0.9 % IV SOLN: 250.0000 mL | INTRAVENOUS | Status: DC

## 2012-07-04 MED ORDER — ONDANSETRON HCL 4 MG/2ML IJ SOLN
INTRAMUSCULAR | Status: DC | PRN
  Administered 2012-07-04: 4 mg via INTRAVENOUS

## 2012-07-04 MED ORDER — FLEET ENEMA 7-19 GM/118ML RE ENEM: 1.0000 | ENEMA | Freq: Once | RECTAL | Status: AC | PRN

## 2012-07-04 MED ORDER — HYDROMORPHONE HCL PF 1 MG/ML IJ SOLN
INTRAMUSCULAR | Status: AC
  Administered 2012-07-04: 0.5 mg via INTRAVENOUS
  Filled 2012-07-04: qty 1

## 2012-07-04 MED ORDER — VALSARTAN-HYDROCHLOROTHIAZIDE 160-12.5 MG PO TABS
1.0000 | ORAL_TABLET | Freq: Every day | ORAL | Status: DC
Start: 2012-07-04 — End: 2012-07-04

## 2012-07-04 MED ORDER — LIDOCAINE HCL (CARDIAC) 20 MG/ML IV SOLN
INTRAVENOUS | Status: DC | PRN
  Administered 2012-07-04: 70 mg via INTRAVENOUS

## 2012-07-04 MED ORDER — LACTATED RINGERS IV SOLN
INTRAVENOUS | Status: DC | PRN
  Administered 2012-07-04 (×3): via INTRAVENOUS

## 2012-07-04 MED ORDER — METOPROLOL TARTRATE 50 MG PO TABS
50.0000 mg | ORAL_TABLET | Freq: Two times a day (BID) | ORAL | Status: DC
  Administered 2012-07-04 – 2012-07-06 (×4): 50 mg via ORAL
  Filled 2012-07-04 (×6): qty 1

## 2012-07-04 MED ORDER — DOCUSATE SODIUM 100 MG PO CAPS
100.0000 mg | ORAL_CAPSULE | Freq: Two times a day (BID) | ORAL | Status: DC
  Administered 2012-07-04 – 2012-07-06 (×4): 100 mg via ORAL
  Filled 2012-07-04 (×5): qty 1

## 2012-07-04 MED ORDER — CEFAZOLIN SODIUM 1-5 GM-% IV SOLN
INTRAVENOUS | Status: AC
  Filled 2012-07-04: qty 50

## 2012-07-04 MED ORDER — CEFAZOLIN SODIUM 1-5 GM-% IV SOLN
1.0000 g | Freq: Three times a day (TID) | INTRAVENOUS | Status: AC
  Administered 2012-07-04 (×2): 1 g via INTRAVENOUS
  Filled 2012-07-04 (×2): qty 50

## 2012-07-04 MED ORDER — THROMBIN 20000 UNITS EX SOLR
CUTANEOUS | Status: AC
  Filled 2012-07-04: qty 20000

## 2012-07-04 MED ORDER — HYDROMORPHONE HCL PF 1 MG/ML IJ SOLN
0.2500 mg | INTRAMUSCULAR | Status: DC | PRN
  Administered 2012-07-04 (×2): 0.5 mg via INTRAVENOUS

## 2012-07-04 MED ORDER — HYDROCHLOROTHIAZIDE 12.5 MG PO CAPS
12.5000 mg | ORAL_CAPSULE | Freq: Every day | ORAL | Status: DC
  Administered 2012-07-04 – 2012-07-05 (×2): 12.5 mg via ORAL
  Filled 2012-07-04 (×3): qty 1

## 2012-07-04 MED ORDER — ACETAMINOPHEN 10 MG/ML IV SOLN
1000.0000 mg | Freq: Four times a day (QID) | INTRAVENOUS | Status: AC
  Administered 2012-07-04 – 2012-07-05 (×3): 1000 mg via INTRAVENOUS
  Filled 2012-07-04 (×4): qty 100

## 2012-07-04 MED ORDER — ACETAMINOPHEN 10 MG/ML IV SOLN
1000.0000 mg | Freq: Once | INTRAVENOUS | Status: AC
  Administered 2012-07-04: 1000 mg via INTRAVENOUS

## 2012-07-04 MED ORDER — OXYCODONE HCL 5 MG PO TABS: 5.0000 mg | ORAL_TABLET | Freq: Once | ORAL | Status: DC | PRN

## 2012-07-04 MED ORDER — SODIUM CHLORIDE 0.9 % IJ SOLN
3.0000 mL | Freq: Two times a day (BID) | INTRAMUSCULAR | Status: DC
  Administered 2012-07-05: 3 mL via INTRAVENOUS

## 2012-07-04 MED ORDER — MIDAZOLAM HCL 5 MG/5ML IJ SOLN
INTRAMUSCULAR | Status: DC | PRN
  Administered 2012-07-04 (×2): 1 mg via INTRAVENOUS

## 2012-07-04 MED ORDER — ONDANSETRON HCL 4 MG/2ML IJ SOLN: 4.0000 mg | INTRAMUSCULAR | Status: DC | PRN

## 2012-07-04 MED ORDER — ZOLPIDEM TARTRATE 5 MG PO TABS: 5.0000 mg | ORAL_TABLET | Freq: Every evening | ORAL | Status: DC | PRN

## 2012-07-04 MED ORDER — OXYCODONE HCL 5 MG/5ML PO SOLN: 5.0000 mg | Freq: Once | ORAL | Status: DC | PRN

## 2012-07-04 MED ORDER — THROMBIN 20000 UNITS EX SOLR
OROMUCOSAL | Status: DC | PRN
  Administered 2012-07-04: 09:00:00 via TOPICAL

## 2012-07-04 MED ORDER — SUFENTANIL CITRATE 50 MCG/ML IV SOLN
INTRAVENOUS | Status: DC | PRN
  Administered 2012-07-04 (×6): 5 ug via INTRAVENOUS
  Administered 2012-07-04: 10 ug via INTRAVENOUS
  Administered 2012-07-04 (×2): 5 ug via INTRAVENOUS

## 2012-07-04 MED ORDER — ONDANSETRON HCL 4 MG/2ML IJ SOLN: INTRAMUSCULAR | Status: DC | PRN

## 2012-07-04 MED ORDER — HEMOSTATIC AGENTS (NO CHARGE) OPTIME
TOPICAL | Status: DC | PRN
  Administered 2012-07-04: 1 via TOPICAL

## 2012-07-04 MED ORDER — ALBUMIN HUMAN 5 % IV SOLN
INTRAVENOUS | Status: DC | PRN
  Administered 2012-07-04: 10:00:00 via INTRAVENOUS

## 2012-07-04 MED ORDER — PROMETHAZINE HCL 25 MG/ML IJ SOLN: 6.2500 mg | INTRAMUSCULAR | Status: DC | PRN

## 2012-07-04 MED ORDER — PHENYLEPHRINE HCL 10 MG/ML IJ SOLN
10.0000 mg | INTRAVENOUS | Status: DC | PRN
  Administered 2012-07-04: 25 ug/min via INTRAVENOUS

## 2012-07-04 MED ORDER — ARTIFICIAL TEARS OP OINT
TOPICAL_OINTMENT | OPHTHALMIC | Status: DC | PRN
  Administered 2012-07-04: 1 via OPHTHALMIC

## 2012-07-04 MED ORDER — METHOCARBAMOL 500 MG PO TABS
500.0000 mg | ORAL_TABLET | Freq: Four times a day (QID) | ORAL | Status: DC | PRN
  Administered 2012-07-05 – 2012-07-06 (×2): 500 mg via ORAL
  Filled 2012-07-04 (×3): qty 1

## 2012-07-04 MED ORDER — ACETAMINOPHEN 10 MG/ML IV SOLN
INTRAVENOUS | Status: AC
  Filled 2012-07-04: qty 100

## 2012-07-04 MED ORDER — PHENYLEPHRINE HCL 10 MG/ML IJ SOLN
INTRAMUSCULAR | Status: DC | PRN
  Administered 2012-07-04 (×6): 40 ug via INTRAVENOUS

## 2012-07-04 MED ORDER — DIPHENHYDRAMINE HCL 12.5 MG/5ML PO ELIX: 12.5000 mg | ORAL_SOLUTION | Freq: Four times a day (QID) | ORAL | Status: DC | PRN

## 2012-07-04 MED ORDER — DEXAMETHASONE SODIUM PHOSPHATE 10 MG/ML IJ SOLN
INTRAMUSCULAR | Status: DC | PRN
  Administered 2012-07-04: 6 mg via INTRAVENOUS

## 2012-07-04 MED ORDER — SIMVASTATIN 40 MG PO TABS
40.0000 mg | ORAL_TABLET | Freq: Every evening | ORAL | Status: DC
  Administered 2012-07-04 – 2012-07-05 (×2): 40 mg via ORAL
  Filled 2012-07-04 (×3): qty 1

## 2012-07-04 MED ORDER — PHENOL 1.4 % MT LIQD: 1.0000 | OROMUCOSAL | Status: DC | PRN

## 2012-07-04 MED ORDER — MIDAZOLAM HCL 2 MG/2ML IJ SOLN: 1.0000 mg | INTRAMUSCULAR | Status: DC | PRN

## 2012-07-04 MED ORDER — LORATADINE 10 MG PO TABS
10.0000 mg | ORAL_TABLET | Freq: Every day | ORAL | Status: DC
  Administered 2012-07-04 – 2012-07-06 (×3): 10 mg via ORAL
  Filled 2012-07-04 (×3): qty 1

## 2012-07-04 MED ORDER — PROPOFOL 10 MG/ML IV BOLUS
INTRAVENOUS | Status: DC | PRN
  Administered 2012-07-04: 20 mg via INTRAVENOUS
  Administered 2012-07-04: 30 mg via INTRAVENOUS
  Administered 2012-07-04: 120 mg via INTRAVENOUS

## 2012-07-04 MED ORDER — FLUTICASONE PROPIONATE 50 MCG/ACT NA SUSP
2.0000 | Freq: Every day | NASAL | Status: DC | PRN
  Filled 2012-07-04: qty 16

## 2012-07-04 MED ORDER — IRBESARTAN 150 MG PO TABS
150.0000 mg | ORAL_TABLET | Freq: Every day | ORAL | Status: DC
  Administered 2012-07-04 – 2012-07-06 (×3): 150 mg via ORAL
  Filled 2012-07-04 (×3): qty 1

## 2012-07-04 MED ORDER — MENTHOL 3 MG MT LOZG: 1.0000 | LOZENGE | OROMUCOSAL | Status: DC | PRN

## 2012-07-04 MED ORDER — MORPHINE SULFATE (PF) 1 MG/ML IV SOLN
INTRAVENOUS | Status: DC
  Administered 2012-07-04: 13:00:00 via INTRAVENOUS

## 2012-07-04 MED ORDER — FENTANYL CITRATE 0.05 MG/ML IJ SOLN: 50.0000 ug | Freq: Once | INTRAMUSCULAR | Status: DC

## 2012-07-04 MED ORDER — BUPIVACAINE-EPINEPHRINE PF 0.25-1:200000 % IJ SOLN
INTRAMUSCULAR | Status: AC
  Filled 2012-07-04: qty 30

## 2012-07-04 MED ORDER — MORPHINE SULFATE (PF) 1 MG/ML IV SOLN
INTRAVENOUS | Status: AC
  Filled 2012-07-04: qty 25

## 2012-07-04 MED ORDER — LACTATED RINGERS IV SOLN
INTRAVENOUS | Status: DC
  Administered 2012-07-05: 16:00:00 via INTRAVENOUS
  Administered 2012-07-05: 85 mL via INTRAVENOUS

## 2012-07-04 MED ORDER — DEXTROSE 5 % IV SOLN
500.0000 mg | Freq: Four times a day (QID) | INTRAVENOUS | Status: DC | PRN
  Administered 2012-07-04: 500 mg via INTRAVENOUS
  Filled 2012-07-04: qty 5

## 2012-07-04 MED ORDER — SODIUM CHLORIDE 0.9 % IJ SOLN: 3.0000 mL | INTRAMUSCULAR | Status: DC | PRN

## 2012-07-04 MED ORDER — SODIUM CHLORIDE 0.9 % IJ SOLN: 9.0000 mL | INTRAMUSCULAR | Status: DC | PRN

## 2012-07-04 MED ORDER — EPHEDRINE SULFATE 50 MG/ML IJ SOLN
INTRAMUSCULAR | Status: DC | PRN
  Administered 2012-07-04 (×2): 5 mg via INTRAVENOUS
  Administered 2012-07-04: 10 mg via INTRAVENOUS
  Administered 2012-07-04: 5 mg via INTRAVENOUS
  Administered 2012-07-04: 10 mg via INTRAVENOUS
  Administered 2012-07-04: 5 mg via INTRAVENOUS

## 2012-07-04 MED ORDER — 0.9 % SODIUM CHLORIDE (POUR BTL) OPTIME
TOPICAL | Status: DC | PRN
  Administered 2012-07-04: 1000 mL

## 2012-07-04 MED ORDER — BUPIVACAINE-EPINEPHRINE 0.25% -1:200000 IJ SOLN
INTRAMUSCULAR | Status: DC | PRN
  Administered 2012-07-04: 10 mL

## 2012-07-04 MED ORDER — LIDOCAINE HCL 4 % MT SOLN
OROMUCOSAL | Status: DC | PRN
  Administered 2012-07-04: 4 mL via TOPICAL

## 2012-07-04 MED ORDER — ALBUTEROL SULFATE HFA 108 (90 BASE) MCG/ACT IN AERS
INHALATION_SPRAY | RESPIRATORY_TRACT | Status: DC | PRN
  Administered 2012-07-04 (×2): 2 via RESPIRATORY_TRACT

## 2012-07-04 MED ORDER — SUCCINYLCHOLINE CHLORIDE 20 MG/ML IJ SOLN
INTRAMUSCULAR | Status: DC | PRN
  Administered 2012-07-04: 100 mg via INTRAVENOUS

## 2012-07-04 MED ORDER — ONDANSETRON HCL 4 MG/2ML IJ SOLN: 4.0000 mg | Freq: Four times a day (QID) | INTRAMUSCULAR | Status: DC | PRN

## 2012-07-04 MED ORDER — NALOXONE HCL 0.4 MG/ML IJ SOLN: 0.4000 mg | INTRAMUSCULAR | Status: DC | PRN

## 2012-07-04 SURGICAL SUPPLY — 70 items
BLADE SURG ROTATE 9660 (MISCELLANEOUS) IMPLANT
BUR EGG ELITE 4.0 (BURR) ×2 IMPLANT
CLOTH BEACON ORANGE TIMEOUT ST (SAFETY) ×2 IMPLANT
CLSR STERI-STRIP ANTIMIC 1/2X4 (GAUZE/BANDAGES/DRESSINGS) ×2 IMPLANT
CORDS BIPOLAR (ELECTRODE) ×2 IMPLANT
COVER MAYO STAND STRL (DRAPES) ×4 IMPLANT
COVER SURGICAL LIGHT HANDLE (MISCELLANEOUS) ×2 IMPLANT
DERMABOND ADVANCED (GAUZE/BANDAGES/DRESSINGS) ×1
DERMABOND ADVANCED .7 DNX12 (GAUZE/BANDAGES/DRESSINGS) ×1 IMPLANT
DRAPE C-ARM 42X72 X-RAY (DRAPES) ×2 IMPLANT
DRAPE ORTHO SPLIT 77X108 STRL (DRAPES) ×1
DRAPE POUCH INSTRU U-SHP 10X18 (DRAPES) ×2 IMPLANT
DRAPE SURG 17X23 STRL (DRAPES) ×2 IMPLANT
DRAPE SURG ORHT 6 SPLT 77X108 (DRAPES) ×1 IMPLANT
DRAPE U-SHAPE 47X51 STRL (DRAPES) ×2 IMPLANT
DRSG MEPILEX BORDER 4X4 (GAUZE/BANDAGES/DRESSINGS) ×6 IMPLANT
DRSG MEPILEX BORDER 4X8 (GAUZE/BANDAGES/DRESSINGS) ×2 IMPLANT
DURAPREP 26ML APPLICATOR (WOUND CARE) ×2 IMPLANT
ELECT BLADE 4.0 EZ CLEAN MEGAD (MISCELLANEOUS)
ELECT BLADE 6.5 EXT (BLADE) ×2 IMPLANT
ELECT REM PT RETURN 9FT ADLT (ELECTROSURGICAL) ×2
ELECTRODE BLDE 4.0 EZ CLN MEGD (MISCELLANEOUS) IMPLANT
ELECTRODE REM PT RTRN 9FT ADLT (ELECTROSURGICAL) ×1 IMPLANT
GLOVE BIOGEL PI IND STRL 6.5 (GLOVE) ×1 IMPLANT
GLOVE BIOGEL PI IND STRL 8.5 (GLOVE) ×1 IMPLANT
GLOVE BIOGEL PI INDICATOR 6.5 (GLOVE) ×1
GLOVE BIOGEL PI INDICATOR 8.5 (GLOVE) ×1
GLOVE ECLIPSE 6.0 STRL STRAW (GLOVE) ×2 IMPLANT
GLOVE ECLIPSE 8.5 STRL (GLOVE) ×2 IMPLANT
GLOVE SURG SS PI 6.5 STRL IVOR (GLOVE) ×6 IMPLANT
GLOVE SURG SS PI 7.0 STRL IVOR (GLOVE) ×4 IMPLANT
GOWN PREVENTION PLUS XXLARGE (GOWN DISPOSABLE) ×2 IMPLANT
GOWN STRL NON-REIN LRG LVL3 (GOWN DISPOSABLE) ×4 IMPLANT
IV CATH 14GX2 1/4 (CATHETERS) ×2 IMPLANT
KIT BASIN OR (CUSTOM PROCEDURE TRAY) ×2 IMPLANT
KIT ORACLE NEUROMONITING (KITS) ×4 IMPLANT
KIT POSITION SURG JACKSON T1 (MISCELLANEOUS) ×2 IMPLANT
KIT ROOM TURNOVER OR (KITS) ×2 IMPLANT
MIX DBX 10CC 35% BONE (Bone Implant) ×2 IMPLANT
Matrix Locking Cap ×8 IMPLANT
Matrix MIS Cannulated Tap 5.0 mm ×2 IMPLANT
Matrix MIS K-Wire ×4 IMPLANT
NEEDLE 22X1 1/2 (OR ONLY) (NEEDLE) ×2 IMPLANT
NEEDLE SPNL 18GX3.5 QUINCKE PK (NEEDLE) ×2 IMPLANT
NS IRRIG 1000ML POUR BTL (IV SOLUTION) ×2 IMPLANT
Oracle Neuromonitoring Kit-Sterile ×4 IMPLANT
PACK LAMINECTOMY ORTHO (CUSTOM PROCEDURE TRAY) ×2 IMPLANT
PACK UNIVERSAL I (CUSTOM PROCEDURE TRAY) ×2 IMPLANT
PAD ARMBOARD 7.5X6 YLW CONV (MISCELLANEOUS) ×4 IMPLANT
PATTIES SURGICAL .5 X.5 (GAUZE/BANDAGES/DRESSINGS) IMPLANT
PATTIES SURGICAL .5 X1 (DISPOSABLE) ×2 IMPLANT
ROD 40MM (Rod) ×1 IMPLANT
ROD MATRIX MIS 35MM (Rod) ×2 IMPLANT
ROD SPNL CVD 40X5.5XHRD NS (Rod) ×1 IMPLANT
SCREW MATRIX MIS 6.0X35MM (Screw) ×4 IMPLANT
SCREW MATRIX MIS 6.0X40MM (Screw) ×4 IMPLANT
SPONGE LAP 4X18 X RAY DECT (DISPOSABLE) ×4 IMPLANT
SPONGE SURGIFOAM ABS GEL 100 (HEMOSTASIS) ×2 IMPLANT
STRIP CLOSURE SKIN 1/2X4 (GAUZE/BANDAGES/DRESSINGS) ×2 IMPLANT
SURGIFLO TRUKIT (HEMOSTASIS) ×2 IMPLANT
SUT MNCRL AB 3-0 PS2 18 (SUTURE) ×4 IMPLANT
SUT VIC AB 2-0 CT1 18 (SUTURE) ×2 IMPLANT
SUT VICRYL 0 UR6 27IN ABS (SUTURE) ×4 IMPLANT
SYR BULB IRRIGATION 50ML (SYRINGE) ×2 IMPLANT
SYR CONTROL 10ML LL (SYRINGE) ×2 IMPLANT
TOWEL OR 17X24 6PK STRL BLUE (TOWEL DISPOSABLE) ×2 IMPLANT
TOWEL OR 17X26 10 PK STRL BLUE (TOWEL DISPOSABLE) ×2 IMPLANT
TRAY FOLEY CATH 14FR (SET/KITS/TRAYS/PACK) ×2 IMPLANT
WATER STERILE IRR 1000ML POUR (IV SOLUTION) ×2 IMPLANT
YANKAUER SUCT BULB TIP NO VENT (SUCTIONS) ×2 IMPLANT

## 2012-07-04 NOTE — Anesthesia Procedure Notes (Signed)
Procedure Name: Intubation Date/Time: 07/04/2012 7:42 AM Performed by: Lovie Chol Pre-anesthesia Checklist: Patient identified, Emergency Drugs available, Suction available, Patient being monitored and Timeout performed Patient Re-evaluated:Patient Re-evaluated prior to inductionOxygen Delivery Method: Circle system utilized Preoxygenation: Pre-oxygenation with 100% oxygen Intubation Type: IV induction Ventilation: Mask ventilation without difficulty Laryngoscope Size: Miller and 2 Grade View: Grade I Tube type: Oral Tube size: 7.0 mm Number of attempts: 1 Airway Equipment and Method: Stylet and LTA kit utilized Placement Confirmation: ETT inserted through vocal cords under direct vision,  positive ETCO2 and breath sounds checked- equal and bilateral Secured at: 21 cm Tube secured with: Tape Dental Injury: Teeth and Oropharynx as per pre-operative assessment

## 2012-07-04 NOTE — H&P (Signed)
H+P REVIEWED NO CHANGE IN CLINICAL EXAM 

## 2012-07-04 NOTE — Preoperative (Signed)
Beta Blockers   Reason not to administer Beta Blockers:Not Applicable 

## 2012-07-04 NOTE — Brief Op Note (Signed)
07/04/2012  11:43 AM  PATIENT:  Carol Bolton  71 y.o. female  PRE-OPERATIVE DIAGNOSIS:  right L5-S1 cyst with nerve compression  POST-OPERATIVE DIAGNOSIS:  right L5-S1 cyst with nerve compression  PROCEDURE:  Procedure(s) (LRB) with comments: POSTERIOR LUMBAR FUSION 1 LEVEL (N/A) - TLIF L5-SI  SURGEON:  Surgeon(s) and Role:    * Venita Lick, MD - Primary  PHYSICIAN ASSISTANT:   ASSISTANTS: Norval Gable   ANESTHESIA:   none  EBL:  Total I/O In: 1750 [I.V.:1500; IV Piggyback:250] Out: 350 [Urine:200; Blood:150]  BLOOD ADMINISTERED:none  DRAINS: none   LOCAL MEDICATIONS USED:  MARCAINE     SPECIMEN:  Source of Specimen:  synovial cyst  DISPOSITION OF SPECIMEN:  PATHOLOGY  COUNTS:  YES  TOURNIQUET:  * No tourniquets in log *  DICTATION: .Other Dictation: Dictation Number [id number]  PLAN OF CARE: Admit to inpatient   PATIENT DISPOSITION:  PACU - hemodynamically stable.

## 2012-07-04 NOTE — Anesthesia Postprocedure Evaluation (Signed)
  Anesthesia Post-op Note  Patient: Carol Bolton  Procedure(s) Performed: Procedure(s) (LRB) with comments: POSTERIOR LUMBAR FUSION 1 LEVEL (N/A) - TLIF L5-SI  Patient Location: PACU  Anesthesia Type: General  Level of Consciousness: awake and alert   Airway and Oxygen Therapy: Patient Spontanous Breathing  Post-op Pain: mild  Post-op Assessment: Post-op Vital signs reviewed, Patient's Cardiovascular Status Stable, Respiratory Function Stable, Patent Airway, No signs of Nausea or vomiting and Pain level controlled  Post-op Vital Signs: stable  Complications: No apparent anesthesia complications

## 2012-07-04 NOTE — Transfer of Care (Signed)
Immediate Anesthesia Transfer of Care Note  Patient: Carol Bolton  Procedure(s) Performed: Procedure(s) (LRB) with comments: POSTERIOR LUMBAR FUSION 1 LEVEL (N/A) - TLIF L5-SI  Patient Location: PACU  Anesthesia Type: General  Level of Consciousness: awake, alert  and oriented  Airway & Oxygen Therapy: Patient Spontanous Breathing and Patient connected to nasal cannula oxygen  Post-op Assessment: Report given to PACU RN and Post -op Vital signs reviewed and stable  Post vital signs: Reviewed  Complications: No apparent anesthesia complications

## 2012-07-04 NOTE — Anesthesia Preprocedure Evaluation (Addendum)
Anesthesia Evaluation  Patient identified by MRN, date of birth, ID band Patient awake    Reviewed: Allergy & Precautions, H&P , NPO status , Patient's Chart, lab work & pertinent test results, reviewed documented beta blocker date and time   History of Anesthesia Complications Negative for: history of anesthetic complications  Airway Mallampati: I TM Distance: >3 FB Neck ROM: Full    Dental  (+) Teeth Intact and Dental Advisory Given   Pulmonary  breath sounds clear to auscultation        Cardiovascular hypertension, Pt. on medications and Pt. on home beta blockers Rhythm:Regular Rate:Normal  EKG-BBB   Neuro/Psych Anxiety  Neuromuscular disease    GI/Hepatic   Endo/Other  diabetes, Type 2  Renal/GU      Musculoskeletal   Abdominal   Peds  Hematology   Anesthesia Other Findings   Reproductive/Obstetrics                          Anesthesia Physical Anesthesia Plan  ASA: III  Anesthesia Plan: General   Post-op Pain Management:    Induction: Intravenous  Airway Management Planned: Oral ETT  Additional Equipment:   Intra-op Plan:   Post-operative Plan: Extubation in OR  Informed Consent: I have reviewed the patients History and Physical, chart, labs and discussed the procedure including the risks, benefits and alternatives for the proposed anesthesia with the patient or authorized representative who has indicated his/her understanding and acceptance.     Plan Discussed with: CRNA and Surgeon  Anesthesia Plan Comments:         Anesthesia Quick Evaluation

## 2012-07-05 MED ORDER — POLYETHYLENE GLYCOL 3350 17 G PO PACK: 17.0000 g | PACK | Freq: Every day | ORAL | Status: DC

## 2012-07-05 MED ORDER — OXYCODONE-ACETAMINOPHEN 10-325 MG PO TABS: 1.0000 | ORAL_TABLET | Freq: Four times a day (QID) | ORAL | Status: DC | PRN

## 2012-07-05 MED ORDER — OXYCODONE HCL 5 MG PO TABS
5.0000 mg | ORAL_TABLET | ORAL | Status: DC | PRN
  Administered 2012-07-05 – 2012-07-06 (×3): 5 mg via ORAL
  Filled 2012-07-05 (×4): qty 1

## 2012-07-05 MED ORDER — ONDANSETRON HCL 4 MG PO TABS: 4.0000 mg | ORAL_TABLET | Freq: Three times a day (TID) | ORAL | Status: DC | PRN

## 2012-07-05 MED ORDER — METHOCARBAMOL 500 MG PO TABS: 500.0000 mg | ORAL_TABLET | Freq: Three times a day (TID) | ORAL | Status: DC

## 2012-07-05 MED ORDER — SODIUM CHLORIDE 0.9 % IV BOLUS (SEPSIS)
500.0000 mL | Freq: Once | INTRAVENOUS | Status: AC
  Administered 2012-07-05: 500 mL via INTRAVENOUS

## 2012-07-05 NOTE — Progress Notes (Signed)
Referral received for SNF. Chart reviewed and CSW has spoken with RNCM who indicates that patient is for DC to home with no home care needs identified. Pt is stable for d/c home today.  CSW to sign off.  Lorri Frederick. [place]  [phone]

## 2012-07-05 NOTE — Op Note (Signed)
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  OPERATIVE PROCEDURE:  Posterior Gill decompression, L5-S1 with instrumented pedicle screw fusion and arthrodesis L5-S1.  FIRST ASSISTANT:  Norval Gable, PA.  INTRAOPERATIVE FINDINGS:  The patient had a significantly large synovial cyst that was quite adherent to the thecal sac at L5-S1.  I was able to excise the cyst in its entirety, and sent the specimen to pathology.  Of note, during the procedure, initially I was at the L5 lamina and then inadvertently shifted up 1 level and performed an L4 laminotomy and facetectomy, but did identify the area, repositioned and performed the procedure at the appropriate level.  The patient's family was made aware and the patient herself will be made aware when she is alert and orientated.  HISTORY:  This is a very pleasant elderly woman who has been complaining of severe back pain and radicular leg pain.  Attempts at conservative management had failed to alleviate her symptoms.  She was noted to have a significantly large synovial cyst.  After discussing treatment options, she elected to proceed with surgery.  Initially, I thought the TLIF procedure would be necessary.  However, intraoperatively, I made the decision not to perform a TLIF as I had excellent fixation and graft positioning.  OPERATIVE NOTE:  The patient was brought to the operating room, placed supine on the operating table.  After successful induction of general anesthesia and  endotracheal intubation, TEDs, SCDs were applied. Intraoperative neuro monitoring needles were placed by the NeuroStim representative and she was turned prone onto the Wilson frame.  All bony prominences were well padded and the back was prepped and draped in a standard fashion.  Once this was completed, we did a time-out confirming patient, procedure, and all other pertinent important data.  The wounds were infiltrated with 0.25% Marcaine and then a small 1 inch incision was made centered over the L5-S1 pedicle on the left side. Pedicle probes were then advanced percutaneously down to the lateral aspect of the L5 pedicle.  This was right at the junction of the transverse process and the facet.  I confirmed satisfactory position in both the AP and lateral planes.  I then connected the Jamshidi needle to the NeuroStim monitor and using x-ray and MRI neuromonitoring I advanced the Jamshidi through the L5 pedicle and into the L5 vertebral body.  I confirmed that there was no neurodiagnostic evidence of breach. Clinically and radiographically, there was no evidence of any breach.  I then placed the guide pin through the Jamshidi needle, removed the Jamshidi needle and then repeated this procedure at S1.  Again, there was no neurodiagnostic evidence of pedicle breach.  Once both pedicles were cannulated, I then tapped and then placed  the pedicle screws over the cannula.  I used a Synthes MIS pedicle screw with 40 mm x 6.0 diameter at L5 and a 35 at S1.  I then stim'ed both screws and there was no neurodiagnostic evidence of pedicle breach.  At this point, once the screws were properly positioned on the left side, I then went to the right side.  I again identified the L5-S1 intervertebral space and then made an incision slightly larger than the one on the right.  This was a Wiltse type incision that was 1-1/2 fingerbreadths off from the midline. I dissected down to the deep fascia and excised  the deep fascia and then immobilized the paraspinal muscles.  I then placed self-retaining retractors (Caspar retracting blades) into the wound, expanded them and exposed the L5-S1 level.  I confirmed with fluoroscopy that I was at the L5 lamina.  Once this was done, I completely exposed, I used my curette to remove the soft tissue from the lamina and then developed a plane underneath the lamina, then used a 3 mm Kerrison to resect the lamina. I then used an osteotome to remove the facet complex.  Once this was done, I then dissected into the lateral gutter.  I identified what I thought was the traversing S1 nerve root.  I then went superiorly until I exposed the exiting nerve root.  At this point, I did not see any significant cyst that corresponded to the MRI.  Given this, I then rechecked my x-rays and realized that I unfortunately had been shifted superiorly.  Given this, I then repositioned my retractor blades and performed a complete L5 laminectomy.  I could now visualize a very large significant synovial cyst that was very adherent to the thecal sac. Fortunately, with the L4 laminotomy and decompression, I was able to identify the L5 nerve root as it was exiting the thecal sac and traced it down, and this assisted me in developing a plane between the synovial cyst and the L5 nerve root.  I utilized this plane to begin to resect the cyst from off the thecal sac.  Once I had the cyst removed in its entirety, there was significant improvement in the overall space for the S1 nerve root, as well as the L5 nerve root.  I could now freely take a [facility] along the S1 nerve root and the S1 foramen and I could completely visualize the L5 nerve root as it exited the thecal sac, came around the L5 pedicle and out the L5 foramen.  At this point, I was very pleased with my decompression.  At this point, since the left L4-5 facet complex was not traumatized at all, I did not feel as  though there was increased risk of instability at L4-5.  I elected not to include this in my fusion.  At this point, with the decompression complete, I then examined the disk at L5-S1.  Because of its tall nature, I did not feel as though I needed to do the interbody portion that I discussed with the patient.  My concern is with such a total disk it would be difficult to get a solid fusion and it would require a very large implant which could increase the risk of nerve traction.  Furthermore, there was no significant left nerve root foraminal compromise and no significant left- sided radiculopathy to warrant an indirect decompression.  At this point, I then placed pedicle screws using the Jamshidi needle to cannulate the pedicle and then tapped.  Once this was done under direct visualization, I then probed each of the pedicle holes with a ball-tip feeler to confirm that we are appropriate.  I then placed the appropriate size screws same as I did on the other side.  I then stimulated the screws as an added means to make sure that I had the appropriate trajectory.  Once this was done, I used a high-speed burr to decorticate the sacral ala and L5 lamina and then placed a bone graft that I had harvested from the decompression in the posterolateral gutter.  I then secured the pedicle screws with an appropriate size rod and torqued and capped down appropriately.  I irrigated the wound copiously with normal saline, obtained hemostasis using bipolar electrocautery.  I then placed thrombin-soaked Gelfoam patty against the thecal sac.  I then closed the deep fascia with interrupted #1 Vicryl sutures, and then used a layer of 2-0 Vicryl sutures and a 3-0 Monocryl. On the contralateral side, I exposed the ala, again decorticated with a high-speed burr and osteotome and then packed the posterolateral gutter with bone graft.  I then secured a 35-mm rod on this side and torqued all the locking nuts.  I  then closed this side in the same fashion. Steri-Strips and dry dressings were applied.  At the end of the case, all needle and sponge counts were correct.     Alvy Beal, MD     DDB/MEDQ  D:  07/04/2012  T:  07/05/2012  Job:  [id number]

## 2012-07-05 NOTE — Evaluation (Signed)
Physical Therapy Evaluation and Discharge.  Patient Details Name: Carol Bolton MRN: [id number] DOB: 1940-12-10 Today's Date: 07/05/2012 Time: 4098-1191 PT Time Calculation (min): 32 min  PT Assessment / Plan / Recommendation Clinical Impression  Pt is a 71 y/o female s/p lumbar fusion surgery.  All education completed. Pt is independent with mobility.  Acute PT signing off.     PT Assessment  Patent does not need any further PT services    Follow Up Recommendations  No PT follow up    Does the patient have the potential to tolerate intense rehabilitation      Barriers to Discharge        Equipment Recommendations  None recommended by PT    Recommendations for Other Services OT consult   Frequency      Precautions / Restrictions Precautions Precautions: Back Precaution Booklet Issued: Yes (comment) Precaution Comments: Educated pt in back precautions and use of corset brace.   Restrictions Weight Bearing Restrictions: No   Pertinent Vitals/Pain Pain in low back 3/10 no intervention required per pt.       Mobility  Bed Mobility Bed Mobility: Rolling Left;Left Sidelying to Sit;Sit to Sidelying Left Transfers Transfers: Sit to Stand;Stand to Sit Sit to Stand: 4: Min guard;From chair/3-in-1;With upper extremity assist;From bed;From elevated surface Stand to Sit: 4: Min guard;5: Supervision;To chair/3-in-1;With armrests;To bed Ambulation/Gait Ambulation/Gait Assistance: 7: Independent Ambulation Distance (Feet): 200 Feet Assistive device: None Gait Pattern: Within Functional Limits Stairs: Yes Stairs Assistance: 7: Independent Stair Management Technique: No rails;Forwards Number of Stairs: 4     Shoulder Instructions     Exercises     PT Diagnosis:    PT Problem List:   PT Treatment Interventions:     PT Goals Acute Rehab PT Goals PT Goal Formulation: With patient  Visit Information  Last PT Received On: 07/05/12 Assistance Needed: +1    Subjective  Data  Subjective: agree to PT eval. Patient Stated Goal: Return to home to care for Grandchildren   Prior Functioning  Home Living Lives With: Spouse Available Help at Discharge: Family;Available 24 hours/day Type of Home: House Home Access: Stairs to enter [employer] of Steps: 2 Entrance Stairs-Rails: None Home Layout: One level Bathroom Shower/Tub: Forensic scientist: Standard Bathroom Accessibility: Yes How Accessible: Accessible via walker Home Adaptive Equipment: None Prior Function Level of Independence: Independent Able to Take Stairs?: Yes Driving: Yes Vocation: Retired Musician: No difficulties Dominant Hand: Right    Cognition  Overall Cognitive Status: Appears within functional limits for tasks assessed/performed Arousal/Alertness: Awake/alert Orientation Level: Appears intact for tasks assessed Behavior During Session: [facility] for tasks performed    Extremity/Trunk Assessment Right Upper Extremity Assessment RUE ROM/Strength/Tone: Within functional levels Left Upper Extremity Assessment LUE ROM/Strength/Tone: Within functional levels Right Lower Extremity Assessment RLE ROM/Strength/Tone: Within functional levels Left Lower Extremity Assessment LLE ROM/Strength/Tone: Within functional levels Trunk Assessment Trunk Assessment: Normal   Balance    End of Session PT - End of Session Equipment Utilized During Treatment: Gait belt;Back brace Activity Tolerance: Treatment limited secondary to agitation  GP     Loys Hoselton 07/05/2012, 11:52 AM  Theron Arista L. Domnic Vantol DPT [phone]

## 2012-07-05 NOTE — Discharge Summary (Signed)
Patient ID: Carol Bolton MRN: [id number] DOB/AGE: [birth year] 71 y.o.  Admit date: 07/04/2012 Discharge date: 07/06/2012  Admission Diagnoses:  Active Problems:  Synovial cyst of lumbar facet joint   Discharge Diagnoses:  Active Problems:  Synovial cyst of lumbar facet joint  status post Procedure(s): POSTERIOR LUMBAR FUSION 1 LEVEL  Past Medical History  Diagnosis Date  . Hypertension   . Hyperlipidemia   . Anxiety   . Diabetes mellitus   . Bell's palsy   . BBB (bundle branch block)     right bbb  . Neuromuscular disorder     Surgeries: Procedure(s): POSTERIOR LUMBAR FUSION 1 LEVEL on 07/04/2012   Consultants: none  Discharged Condition: Improved  Hospital Course: Carol Bolton is an 72 y.o. female who was admitted 07/04/2012 for operative treatment of synovial cyst of the lumbar facet joint. Patient failed conservative treatments (please see the history and physical for the specifics) and had severe unremitting pain that affects sleep, daily activities and work/hobbies. After pre-op clearance, the patient was taken to the operating room on 07/04/2012 and underwent  Procedure(s): POSTERIOR LUMBAR FUSION 1 LEVEL.    Patient was given perioperative antibiotics: Anti-infectives     Start     Dose/Rate Route Frequency Ordered Stop   07/04/12 1430   ceFAZolin (ANCEF) IVPB 1 g/50 mL premix        1 g 100 mL/hr over 30 Minutes Intravenous Every 8 hours 07/04/12 1355 07/04/12 2342   07/03/12 1410   ceFAZolin (ANCEF) IVPB 2 g/50 mL premix        2 g 100 mL/hr over 30 Minutes Intravenous 60 min pre-op 07/03/12 1410 07/04/12 1118           Patient was given sequential compression devices and early ambulation to prevent DVT.   Patient benefited maximally from hospital stay and there were no complications. At the time of discharge, the patient was urinating/moving their bowels without difficulty, tolerating a regular diet, pain is controlled with oral pain  medications and they have been cleared by PT/OT.   Recent vital signs: Patient Vitals for the past 24 hrs:  BP Temp Pulse Resp SpO2  07/05/12 0642 126/55 mmHg 97.7 F (36.5 C) 74  18  99 %  07/05/12 0352 - - - 18  98 %  07/04/12 2315 - - - 19  97 %  07/04/12 2310 119/50 mmHg - 78  - -  07/04/12 2125 125/45 mmHg 97.6 F (36.4 C) 68  18  99 %  07/04/12 2023 - - - 19  98 %  07/04/12 1700 131/53 mmHg 97.9 F (36.6 C) 72  16  98 %  07/04/12 1542 - - - 14  95 %  07/04/12 1345 150/56 mmHg 97.4 F (36.3 C) 107  16  97 %  07/04/12 1315 138/52 mmHg 97.5 F (36.4 C) 108  17  97 %  07/04/12 1300 140/63 mmHg - 108  19  95 %  07/04/12 1245 132/50 mmHg - 109  10  97 %  07/04/12 1233 - - - 16  -  07/04/12 1230 148/66 mmHg - 111  19  97 %  07/04/12 1218 112/60 mmHg 98.7 F (37.1 C) 111  18  96 %     Recent laboratory studies: No results found for this basename: WBC:2,HGB:2,HCT:2,PLT:2,NA:2,K:2,CL:2,CO2:2,BUN:2,CREATININE:2,GLUCOSE:2,PT:2,INR:2,CALCIUM,2: in the last 72 hours   Discharge Medications:     Medication List     As of 07/05/2012  7:48 AM  TAKE these medications         CALCIUM 1200 PO   Take 1 tablet by mouth daily.      cholecalciferol 1000 UNITS tablet   Commonly known as: VITAMIN D   Take 1,000 Units by mouth daily.      ciprofloxacin 500 MG tablet   Commonly known as: CIPRO   Take 500 mg by mouth 2 (two) times daily. Started sat 10.5.13 ending 10.19.13 for 14 days.      diphenhydrAMINE 25 MG tablet   Commonly known as: BENADRYL   Take 25 mg by mouth at bedtime as needed. For allergies      fluticasone 50 MCG/ACT nasal spray   Commonly known as: FLONASE   Place 2 sprays into the nose daily as needed. For allergies      KRILL OIL PO   Take 1 capsule by mouth daily.      loratadine 10 MG tablet   Commonly known as: CLARITIN   Take 10 mg by mouth daily.      methocarbamol 500 MG tablet   Commonly known as: ROBAXIN   Take 1 tablet (500 mg total) by mouth 3  (three) times daily. MAX 3 pills daily      metoprolol 50 MG tablet   Commonly known as: LOPRESSOR   Take 50 mg by mouth 2 (two) times daily.      ondansetron 4 MG tablet   Commonly known as: ZOFRAN   Take 1 tablet (4 mg total) by mouth every 8 (eight) hours as needed for nausea. MAX 3 pills daily      oxyCODONE-acetaminophen 10-325 MG per tablet   Commonly known as: PERCOCET   Take 1 tablet by mouth every 6 (six) hours as needed for pain. MAX 4 pills daily      polyethylene glycol packet   Commonly known as: MIRALAX / GLYCOLAX   Take 17 g by mouth daily. Take 1 packet daily until bowels become regular      simvastatin 40 MG tablet   Commonly known as: ZOCOR   Take 40 mg by mouth every evening.      valsartan-hydrochlorothiazide 160-12.5 MG per tablet   Commonly known as: DIOVAN-HCT   Take 1 tablet by mouth daily.        Diagnostic Studies:  Chest 2 View  06/27/2012  *RADIOLOGY REPORT*  Clinical Data: Preop lumbar fusion  CHEST - 2 VIEW  Comparison: 03/13/2012  Findings: Lungs are clear. No pleural effusion or pneumothorax.  Cardiomediastinal silhouette is within normal limits.  Mild degenerative changes of the visualized thoracolumbar spine.  IMPRESSION: No evidence of acute cardiopulmonary disease.   Original Report Authenticated By: Charline Bills, M.D.    Dg Lumbar Spine 2-3 Views  07/04/2012  *RADIOLOGY REPORT*  Clinical Data: Postop fusion.  No complaints.  LUMBAR SPINE - 2-3 VIEW  Comparison: Intraoperative exam 07/04/2012.  Preoperative exam 06/27/2012.  Findings: Fusion L5-S1 with bilateral pedicle screws and posterior connecting bar.  Radiopaque material along the lateral aspect of the L5 vertebra greater on the left may represent fusion material.  Clinical correlation recommended.  IMPRESSION: Fusion L5-S1 with bilateral pedicle screws and posterior connecting bar.  Radiopaque material along the lateral aspect of the L5 vertebra greater on the left may represent fusion  material.  Clinical correlation recommended.   Original Report Authenticated By: Fuller Canada, M.D.    Dg Lumbar Spine 2-3 Views  07/04/2012  *RADIOLOGY REPORT*  Clinical Data: Back pain, synovial cyst.  DG C-ARM GT 120 MIN,LUMBAR SPINE - 2-3 VIEW  Comparison: 06/27/2012  Findings: C-arm films document pedicle screws at L5-S1 status post Gill procedure.  Unchanged position and alignment from priors.  IMPRESSION: As above.   Original Report Authenticated By: Elsie Stain, M.D.    Dg Lumbar Spine 2-3 Views  06/27/2012  *RADIOLOGY REPORT*  Clinical Data: Posterior lumbar fusion.  LUMBAR SPINE - 2-3 VIEW  Comparison: No priors.  Findings: The AP and lateral views of the lumbar spine demonstrate no definite acute displaced fracture or compression type fracture. Alignment is anatomic.  Mild multilevel degenerative disc disease and lumbar spondylosis is noted, without focality.  A calcification is seen projecting over the right upper quadrant of the abdomen, possibly a calcified gallstone.  IMPRESSION: 1.  No acute radiographic abnormality of the lumbar spine. 2.  Mild multilevel degenerative disc disease and lumbar spondylosis. 3.  Possible calcified gallstone.   Original Report Authenticated By: Florencia Reasons, M.D.    Dg C-arm Gt 120 Min  07/04/2012  *RADIOLOGY REPORT*  Clinical Data: Back pain, synovial cyst.  DG C-ARM GT 120 MIN,LUMBAR SPINE - 2-3 VIEW  Comparison: 06/27/2012  Findings: C-arm films document pedicle screws at L5-S1 status post Gill procedure.  Unchanged position and alignment from priors.  IMPRESSION: As above.   Original Report Authenticated By: Elsie Stain, M.D.         Discharge Orders    Future Orders Please Complete By Expires   Diet - low sodium heart healthy      Face-to-face encounter (required for Medicare/Medicaid patients)      Comments:   I Gwinda Maine certify that this patient is under my care and that I, or a nurse practitioner or physician's  assistant working with me, had a face-to-face encounter that meets the physician face-to-face encounter requirements with this patient on 07/05/2012.   Questions: Responses:   The encounter with the patient was in whole, or in part, for the following medical condition, which is the primary reason for home health care lumbar decompression with posterior pedicle screw fixation   I certify that, based on my findings, the following services are medically necessary home health services Physical therapy   My clinical findings support the need for the above services OTHER SEE COMMENTS   Further, I certify that my clinical findings support that this patient is homebound due to: Ambulates short distances less than 300 feet   To provide the following care/treatments PT    OT   Call MD / Call 911      Comments:   If you experience chest pain or shortness of breath, CALL 911 and be transported to the hospital emergency room.  If you develope a fever above 101 F, pus (white drainage) or increased drainage or redness at the wound, or calf pain, call your surgeon's office.   Constipation Prevention      Comments:   Drink plenty of fluids.  Prune juice may be helpful.  You may use a stool softener, such as Colace (over the counter) 100 mg twice a day.  Use MiraLax (over the counter) for constipation as needed.   Increase activity slowly as tolerated      Discharge instructions      Comments:   Keep incision clean and dry.  Leave steri strips in place.  May shower 5 days from surgery; pat to dry following shower.  May redress with clean, dry dressing if you would like.  Do  not apply any lotion/cream/ointment to the incision.   Driving restrictions      Comments:   No driving for 2 weeks.  Dr Shon Baton will discuss addition driving restrictions at your first post-op visit in 2 weeks.   Lifting restrictions      Comments:   No lifting anything greater than 5 pounds.  DO NOT reach overhead (above shoulder height).   NO bending, stooping or squatting.  Dr. Shon Baton will discuss additional lifting restrictions at your first post-op visit in 2 weeks.      Follow-up Information    Follow up with Alvy Beal, MD. In 2 weeks.   Contact information:   [facility] [address] [place] [address] [phone]          Discharge Plan:  discharge to home with home health  Disposition: stable at the time of discharge    Signed: Gwinda Maine for Dr. Venita Lick [facility] [phone] 07/05/2012, 7:48 AM

## 2012-07-05 NOTE — Progress Notes (Signed)
UR COMPLETED  

## 2012-07-05 NOTE — Progress Notes (Signed)
CARE MANAGEMENT NOTE 07/05/2012  Patient:  Carol Bolton, Carol Bolton   Account Number:  [id number]  Date Initiated:  07/05/2012  Documentation initiated by:  Vance Peper  Subjective/Objective Assessment:   71 yr old female s/p L4-S1 fusion     Action/Plan:   No home health needs identified   Anticipated DC Date:  07/06/2012   Anticipated DC Plan:  HOME/SELF CARE         Choice offered to / List presented to:             Status of service:  In process, will continue to follow Medicare Important Message given?   (If response is "NO", the following Medicare IM given date fields will be blank) Date Medicare IM given:   Date Additional Medicare IM given:    Discharge Disposition:    Per UR Regulation:    If discussed at Long Length of Stay Meetings, dates discussed:    Comments:

## 2012-07-05 NOTE — Progress Notes (Signed)
OT Cancellation Note  Patient Details Name: Carol Bolton MRN: [id number] DOB: May 04, 1941   Cancelled Treatment:    Reason Eval/Treat Not Completed: Medical issues which prohibited therapy (BP MAP Of 45)  Harrel Carina Moffat Pager: [phone]  07/05/2012, 1:23 PM

## 2012-07-05 NOTE — Progress Notes (Signed)
    Subjective: Procedure(s) (LRB): POSTERIOR LUMBAR FUSION 1 LEVEL (N/A) 1 Day Post-Op  Patient reports pain as 3 on 0-10 scale.  Reports decreased leg pain denies incisional back pain   N/A void Negative bowel movement Positive flatus Negative chest pain or shortness of breath  Objective: Vital signs in last 24 hours: Temp:  [97.4 F (36.3 C)-98.7 F (37.1 C)] 97.7 F (36.5 C) (10/18 0642) Pulse Rate:  [68-111] 74  (10/18 0642) Resp:  [10-19] 18  (10/18 0642) BP: (112-150)/(45-66) 126/55 mmHg (10/18 0642) SpO2:  [95 %-99 %] 99 % (10/18 0642)  Intake/Output from previous day: 10/17 0701 - 10/18 0700 In: 3959.2 [P.O.:790; I.V.:2769.2; IV Piggyback:400] Out: 1150 [Urine:1000; Blood:150]  Labs: No results found for this basename: WBC:2,RBC:2,HCT:2,PLT:2 in the last 72 hours No results found for this basename: NA:2,K:2,CL:2,CO2:2,BUN:2,CREATININE:2,GLUCOSE:2,CALCIUM:2 in the last 72 hours No results found for this basename: LABPT:2,INR:2 in the last 72 hours  Physical Exam: Neurologically intact ABD soft Neurovascular intact Intact pulses distally Incision: dressing C/D/I and no drainage Compartment soft  Assessment/Plan: Patient stable  xrays satisfactory Continue mobilization with physical therapy Continue care  Advance diet Up with therapy D/C IV fluids Plan for discharge tomorrow Foley to be removed this AM Possible D/C to home today if cleared by PT and doing well otherwise will D/C Saturday  Venita Lick, MD [facility] [phone]

## 2012-07-06 NOTE — Progress Notes (Signed)
Carol Bolton  MRN: [id number] DOB/Age: 1941-03-15 71 y.o. Physician: Lynnea Maizes, M.D. 2 Days Post-Op Procedure(s) (LRB): POSTERIOR LUMBAR FUSION 1 LEVEL (N/A)  Subjective: Feeling better, right leg pain improved Vital Signs Temp:  [98.4 F (36.9 C)-98.8 F (37.1 C)] 98.4 F (36.9 C) (10/19 0641) Pulse Rate:  [72-78] 72  (10/19 0641) Resp:  [14-18] 18  (10/19 0641) BP: (80-130)/(29-50) 130/50 mmHg (10/19 0641) SpO2:  [98 %-100 %] 100 % (10/19 0641)  Lab Results No results found for this basename: WBC:2,HGB:2,HCT:2,PLT:2 in the last 72 hours BMET No results found for this basename: NA:2,K:2,CL:2,CO2:2,GLUCOSE:2,BUN:2,CREATININE:2,CALCIUM:2 in the last 72 hours INR  Date Value Range Status  06/27/2012 1.00  0.00 - 1.49 Final     Exam  Dressings clean and dry, moves ankles well with good DF and PF strength  Plan D/C home today, Rx's on chart Kiyoshi Schaab M 07/06/2012, 10:13 AM

## 2012-07-08 MED FILL — Sodium Chloride Irrigation Soln 0.9%: Qty: 3000 | Status: AC

## 2012-07-08 MED FILL — Heparin Sodium (Porcine) Inj 1000 Unit/ML: INTRAMUSCULAR | Qty: 30 | Status: AC

## 2012-07-08 MED FILL — Sodium Chloride IV Soln 0.9%: INTRAVENOUS | Qty: 1000 | Status: AC

## 2012-08-19 ENCOUNTER — Other Ambulatory Visit: Payer: Self-pay | Admitting: Family Medicine

## 2012-08-19 DIAGNOSIS — Z1231 Encounter for screening mammogram for malignant neoplasm of breast: Secondary | ICD-10-CM

## 2012-09-23 ENCOUNTER — Ambulatory Visit
Admission: RE | Admit: 2012-09-23 | Discharge: 2012-09-23 | Disposition: A | Discharge status: Home / Self Care | Payer: Medicare Other | Source: Ambulatory Visit | Attending: Family Medicine | Admitting: Family Medicine

## 2012-09-23 DIAGNOSIS — Z1231 Encounter for screening mammogram for malignant neoplasm of breast: Secondary | ICD-10-CM

## 2012-09-26 ENCOUNTER — Other Ambulatory Visit: Payer: Self-pay | Admitting: Family Medicine

## 2012-09-26 DIAGNOSIS — R928 Other abnormal and inconclusive findings on diagnostic imaging of breast: Secondary | ICD-10-CM

## 2012-10-03 ENCOUNTER — Ambulatory Visit
Admission: RE | Admit: 2012-10-03 | Discharge: 2012-10-03 | Disposition: A | Discharge status: Home / Self Care | Payer: Medicare Other | Source: Ambulatory Visit | Attending: Family Medicine | Admitting: Family Medicine

## 2012-10-03 DIAGNOSIS — R928 Other abnormal and inconclusive findings on diagnostic imaging of breast: Secondary | ICD-10-CM

## 2013-01-24 IMAGING — CT CT PARANASAL SINUSES LIMITED
1 of 2 series · 16 of 19 positions shown, 20 images · non-contrast
Comparison: 06/09/2011

CLINICAL DATA: Recent surgery.  Persistent congestion and
drainage.

CT LIMITED SINUSES WITHOUT CONTRAST
TECHNIQUE: Multidetector CT images of the paranasal sinuses were
obtained in a single plane without contrast.

[Series 4: ltd sinus 3.0 h30s · axial · 0.37mm/px · z∈[-64,+33]mm · 16 of 18 slices shown, 20 images]
[im 2/18  brain]
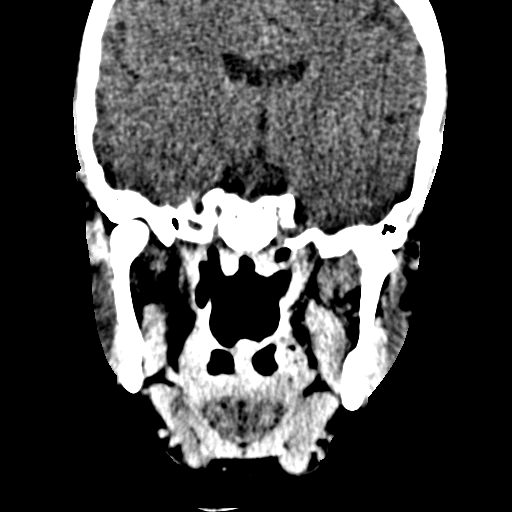
[im 2/18  bone]
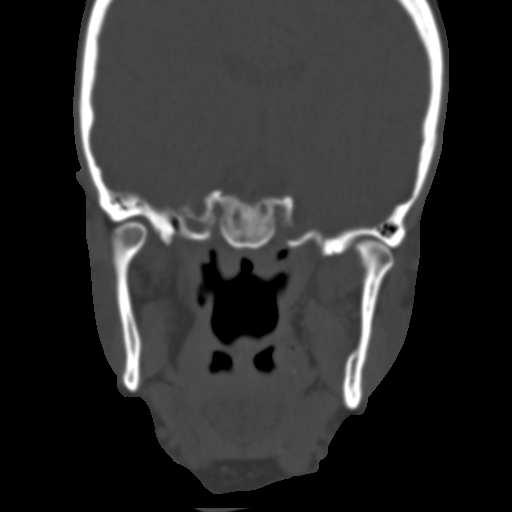
[im 3/18  bone]
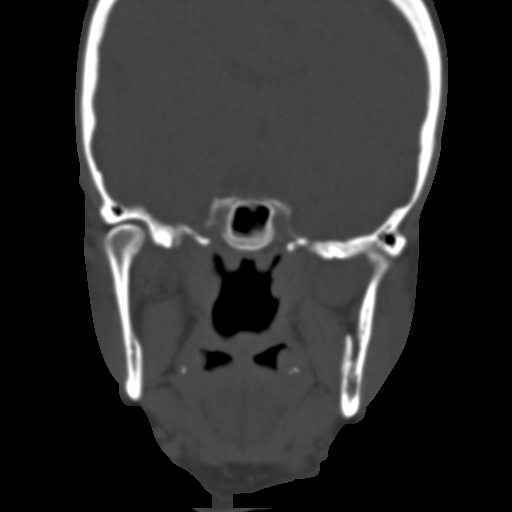
[im 4/18  bone]
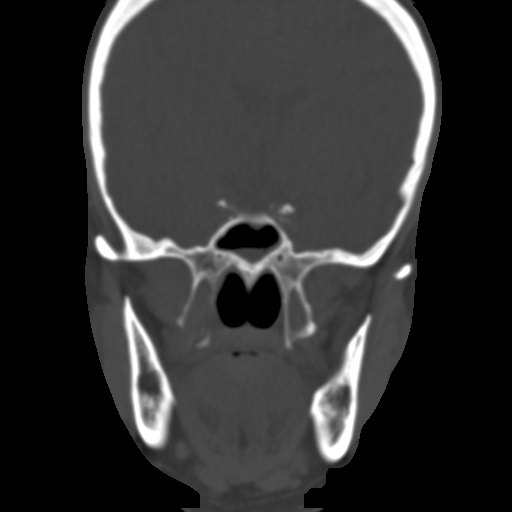
[im 5/18  bone]
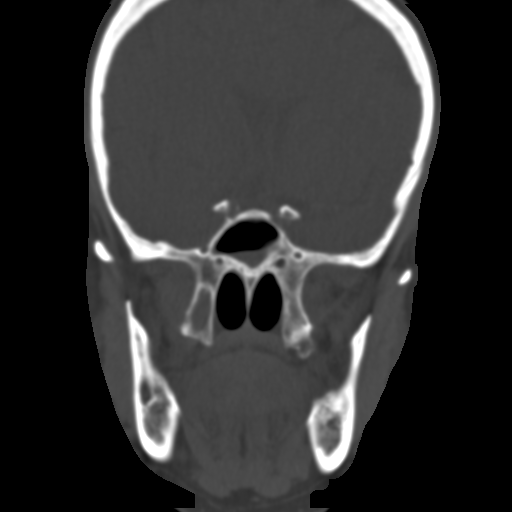
[im 6/18  brain]
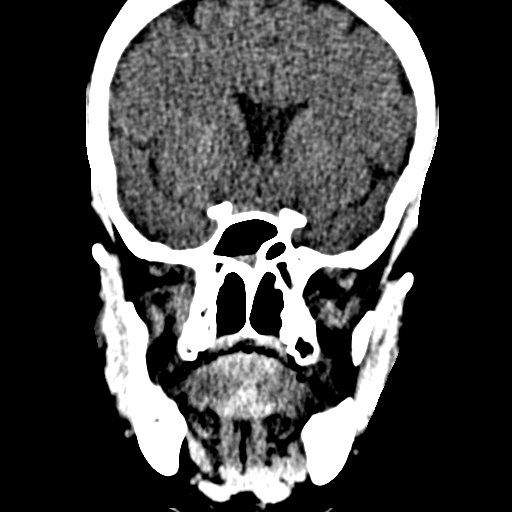
[im 6/18  bone]
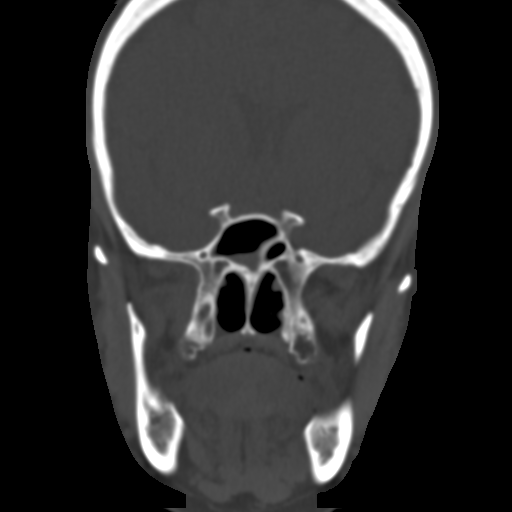
[im 7/18  bone]
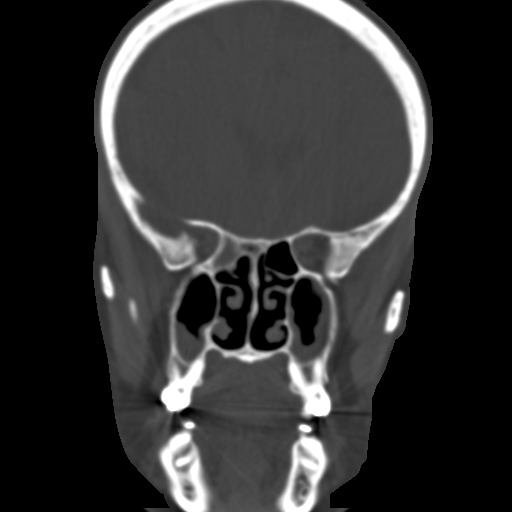
[im 8/18  bone]
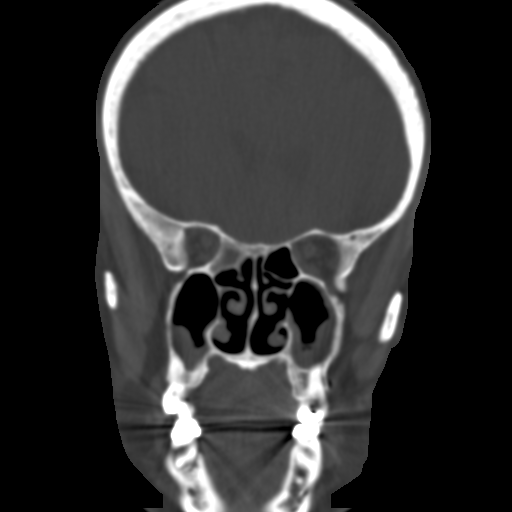
[im 9/18  bone]
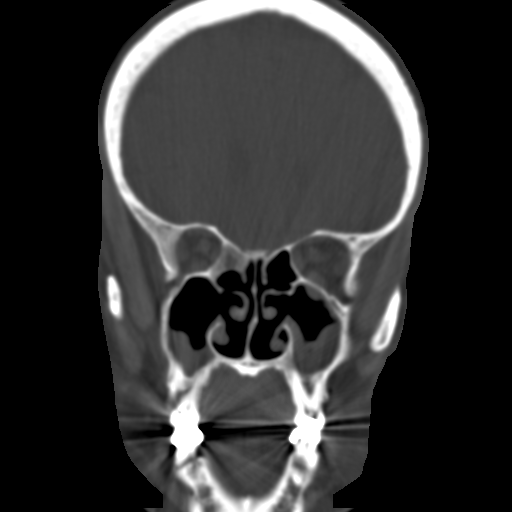
[im 10/18  brain]
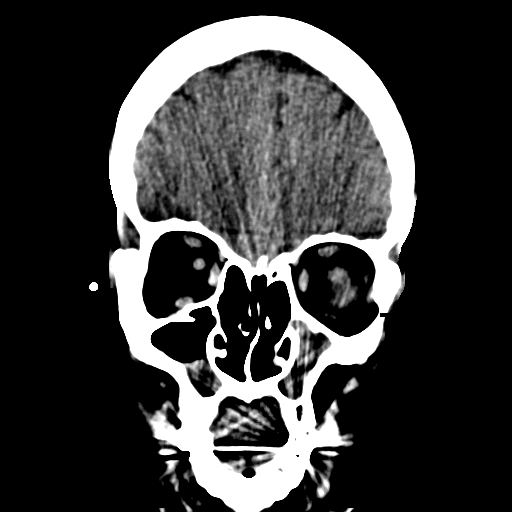
[im 10/18  bone]
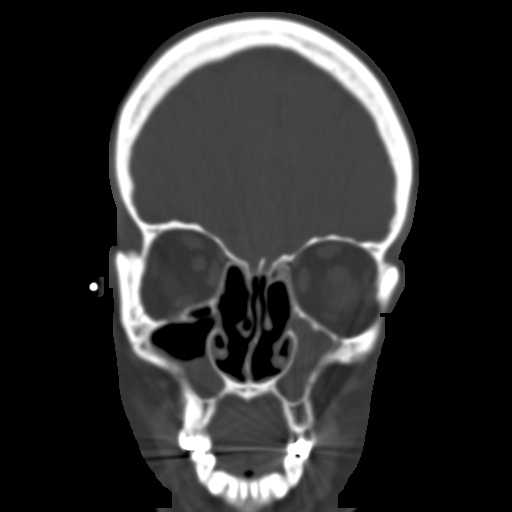
[im 11/18  bone]
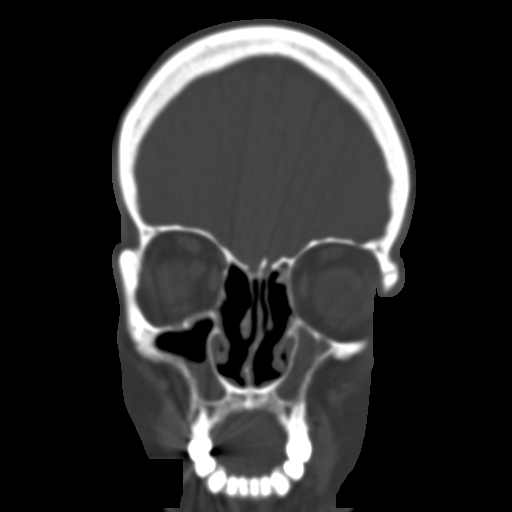
[im 12/18  bone]
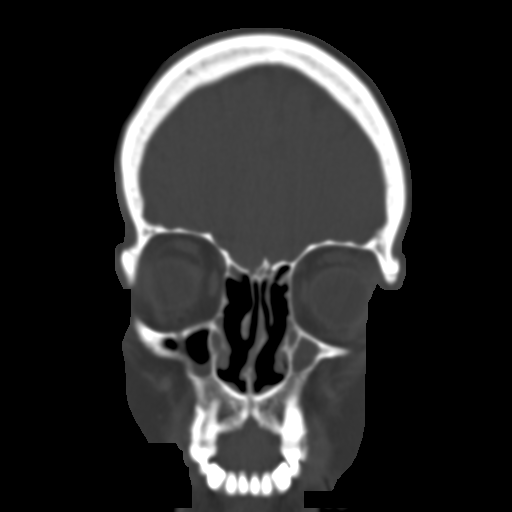
[im 13/18  bone]
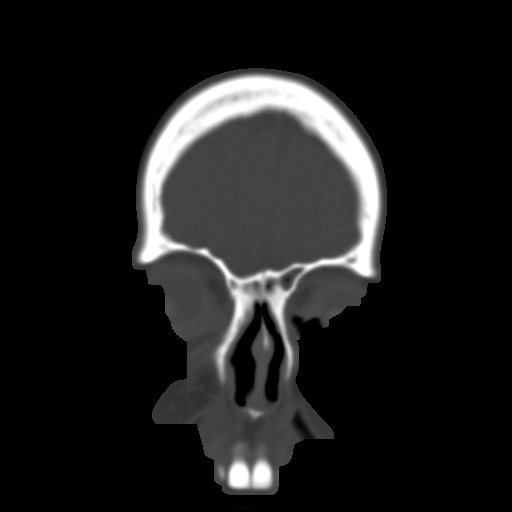
[im 14/18  brain]
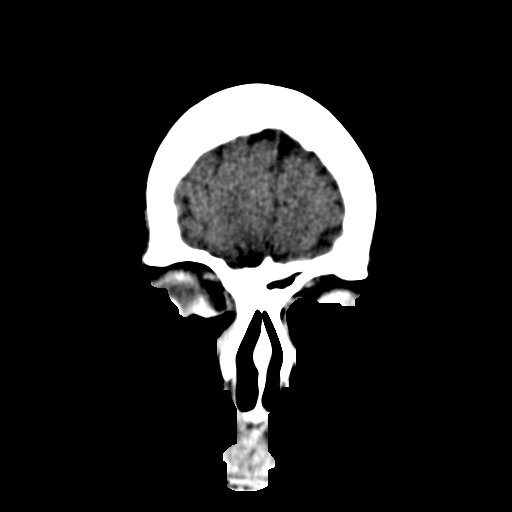
[im 14/18  bone]
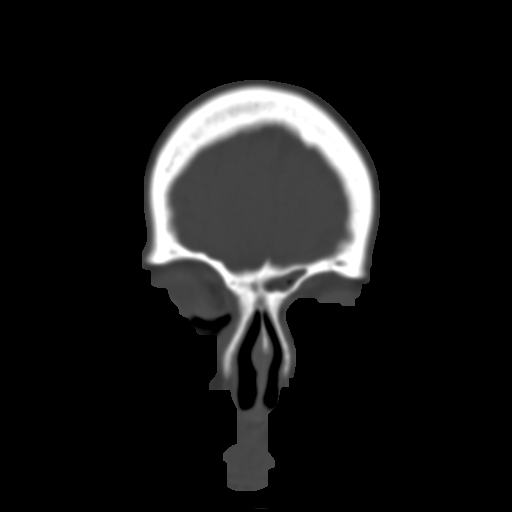
[im 15/18  bone]
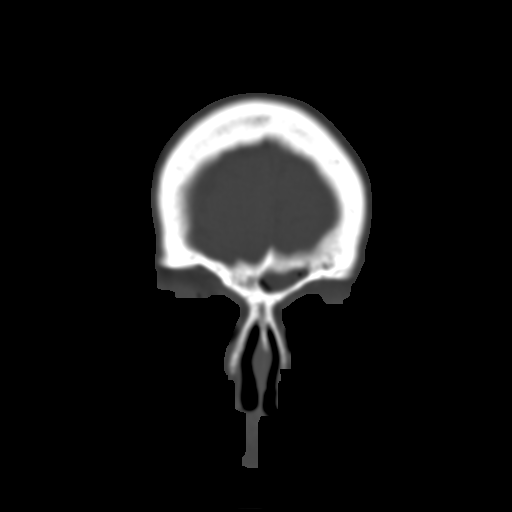
[im 16/18  bone]
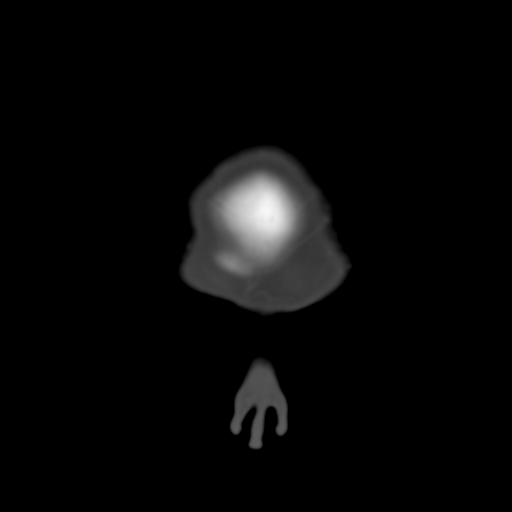
[im 17/18  bone]
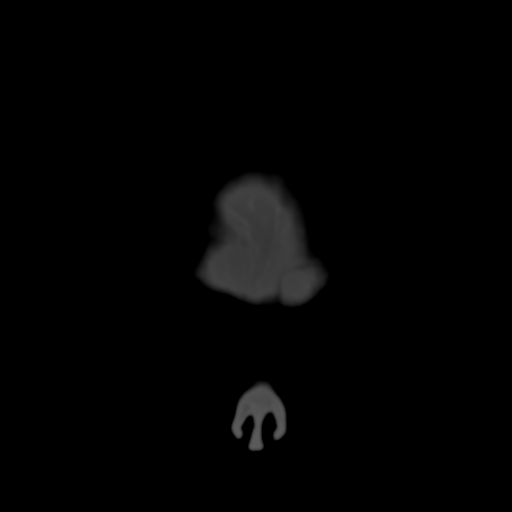

[16 of 19 positions shown; findings below may reference images not displayed]

FINDINGS: The patient has had partial bilateral ethmoidectomies
and nasoantral window creation.  Left division of the frontal sinus
is nearly completely opacified.  Right frontal sinus is aplastic.
There is some mucosal thickening within the residual ethmoid air
cells.  There is diffuse mucosal thickening affecting the maxillary
sinuses, left worse than right.  There is a small amount of
layering fluid on the right.  Sphenoid sinus shows mucosal
thickening with a small amount of fluid.
IMPRESSION: Interval bilateral ethmoidectomies and nasoantral windows.

Widespread mucosal sinus inflammation.

## 2013-03-04 ENCOUNTER — Other Ambulatory Visit: Payer: Self-pay | Admitting: Family Medicine

## 2013-03-04 DIAGNOSIS — R921 Mammographic calcification found on diagnostic imaging of breast: Secondary | ICD-10-CM

## 2013-04-07 ENCOUNTER — Ambulatory Visit
Admission: RE | Admit: 2013-04-07 | Discharge: 2013-04-07 | Disposition: A | Discharge status: Home / Self Care | Payer: Medicare Other | Source: Ambulatory Visit | Attending: Family Medicine | Admitting: Family Medicine

## 2013-04-07 DIAGNOSIS — R921 Mammographic calcification found on diagnostic imaging of breast: Secondary | ICD-10-CM

## 2013-10-15 ENCOUNTER — Other Ambulatory Visit: Payer: Self-pay | Admitting: Family Medicine

## 2013-10-15 DIAGNOSIS — R921 Mammographic calcification found on diagnostic imaging of breast: Secondary | ICD-10-CM

## 2013-10-27 ENCOUNTER — Ambulatory Visit
Admission: RE | Admit: 2013-10-27 | Discharge: 2013-10-27 | Disposition: A | Discharge status: Home / Self Care | Payer: Self-pay | Source: Ambulatory Visit | Attending: Family Medicine | Admitting: Family Medicine

## 2013-10-27 DIAGNOSIS — R921 Mammographic calcification found on diagnostic imaging of breast: Secondary | ICD-10-CM

## 2014-04-20 ENCOUNTER — Other Ambulatory Visit: Payer: Self-pay | Admitting: Gastroenterology

## 2014-11-10 ENCOUNTER — Other Ambulatory Visit: Payer: Self-pay | Admitting: Family Medicine

## 2014-11-10 DIAGNOSIS — R921 Mammographic calcification found on diagnostic imaging of breast: Secondary | ICD-10-CM

## 2014-11-17 ENCOUNTER — Ambulatory Visit
Admission: RE | Admit: 2014-11-17 | Discharge: 2014-11-17 | Disposition: A | Discharge status: Home / Self Care | Payer: Medicare Other | Source: Ambulatory Visit | Attending: Family Medicine | Admitting: Family Medicine

## 2014-11-17 DIAGNOSIS — R921 Mammographic calcification found on diagnostic imaging of breast: Secondary | ICD-10-CM

## 2015-12-07 ENCOUNTER — Other Ambulatory Visit: Payer: Self-pay

## 2015-12-07 DIAGNOSIS — Z1231 Encounter for screening mammogram for malignant neoplasm of breast: Secondary | ICD-10-CM

## 2015-12-09 ENCOUNTER — Ambulatory Visit
Admission: RE | Admit: 2015-12-09 | Discharge: 2015-12-09 | Disposition: A | Discharge status: Home / Self Care | Payer: Medicare Other | Source: Ambulatory Visit

## 2015-12-09 DIAGNOSIS — Z1231 Encounter for screening mammogram for malignant neoplasm of breast: Secondary | ICD-10-CM

## 2015-12-10 ENCOUNTER — Ambulatory Visit
Admission: RE | Admit: 2015-12-10 | Discharge: 2015-12-10 | Disposition: A | Discharge status: Home / Self Care | Payer: Medicare Other | Source: Ambulatory Visit | Attending: Physician Assistant | Admitting: Physician Assistant

## 2015-12-10 ENCOUNTER — Other Ambulatory Visit: Payer: Self-pay | Admitting: Physician Assistant

## 2015-12-10 DIAGNOSIS — R0602 Shortness of breath: Secondary | ICD-10-CM

## 2015-12-10 DIAGNOSIS — R0789 Other chest pain: Secondary | ICD-10-CM

## 2016-12-25 ENCOUNTER — Other Ambulatory Visit: Payer: Self-pay | Admitting: Family Medicine

## 2016-12-25 DIAGNOSIS — Z1231 Encounter for screening mammogram for malignant neoplasm of breast: Secondary | ICD-10-CM

## 2017-01-11 ENCOUNTER — Ambulatory Visit: Payer: Medicare Other

## 2017-01-29 ENCOUNTER — Ambulatory Visit
Admission: RE | Admit: 2017-01-29 | Discharge: 2017-01-29 | Disposition: A | Discharge status: Home / Self Care | Payer: Medicare Other | Source: Ambulatory Visit | Attending: Family Medicine | Admitting: Family Medicine

## 2017-01-29 DIAGNOSIS — Z1231 Encounter for screening mammogram for malignant neoplasm of breast: Secondary | ICD-10-CM

## 2018-01-28 ENCOUNTER — Other Ambulatory Visit: Payer: Self-pay | Admitting: Family Medicine

## 2018-01-28 DIAGNOSIS — Z1231 Encounter for screening mammogram for malignant neoplasm of breast: Secondary | ICD-10-CM

## 2018-02-25 ENCOUNTER — Ambulatory Visit
Admission: RE | Admit: 2018-02-25 | Discharge: 2018-02-25 | Disposition: A | Discharge status: Home / Self Care | Payer: Medicare Other | Source: Ambulatory Visit | Attending: Family Medicine | Admitting: Family Medicine

## 2018-02-25 DIAGNOSIS — Z1231 Encounter for screening mammogram for malignant neoplasm of breast: Secondary | ICD-10-CM

## 2018-12-31 ENCOUNTER — Other Ambulatory Visit: Payer: Self-pay | Admitting: Family Medicine

## 2018-12-31 ENCOUNTER — Ambulatory Visit
Admission: RE | Admit: 2018-12-31 | Discharge: 2018-12-31 | Disposition: A | Discharge status: Home / Self Care | Payer: Medicare Other | Source: Ambulatory Visit | Attending: Family Medicine | Admitting: Family Medicine

## 2018-12-31 DIAGNOSIS — R05 Cough: Secondary | ICD-10-CM

## 2018-12-31 DIAGNOSIS — R059 Cough, unspecified: Secondary | ICD-10-CM

## 2018-12-31 DIAGNOSIS — M545 Low back pain, unspecified: Secondary | ICD-10-CM

## 2019-05-06 ENCOUNTER — Other Ambulatory Visit: Payer: Self-pay | Admitting: Advanced Practice Midwife

## 2019-05-06 ENCOUNTER — Other Ambulatory Visit: Payer: Self-pay | Admitting: Family Medicine

## 2019-05-06 DIAGNOSIS — Z1231 Encounter for screening mammogram for malignant neoplasm of breast: Secondary | ICD-10-CM

## 2019-05-15 ENCOUNTER — Encounter: Payer: Self-pay | Admitting: Cardiology

## 2019-05-15 ENCOUNTER — Other Ambulatory Visit: Payer: Self-pay

## 2019-05-15 ENCOUNTER — Ambulatory Visit (INDEPENDENT_AMBULATORY_CARE_PROVIDER_SITE_OTHER): Payer: Medicare Other | Admitting: Cardiology

## 2019-05-15 ENCOUNTER — Telehealth (HOSPITAL_COMMUNITY): Payer: Self-pay | Admitting: *Deleted

## 2019-05-15 VITALS — BP 146/78 | HR 65 | Ht 62.5 in | Wt 162.4 lb

## 2019-05-15 DIAGNOSIS — E782 Mixed hyperlipidemia: Secondary | ICD-10-CM

## 2019-05-15 DIAGNOSIS — R0789 Other chest pain: Secondary | ICD-10-CM

## 2019-05-15 DIAGNOSIS — I10 Essential (primary) hypertension: Secondary | ICD-10-CM

## 2019-05-15 DIAGNOSIS — I454 Nonspecific intraventricular block: Secondary | ICD-10-CM | POA: Diagnosis not present

## 2019-05-15 HISTORY — DX: Other chest pain: R07.89

## 2019-05-15 NOTE — Addendum Note (Signed)
Addended by: Ashok Norris on: 05/15/2019 11:29 AM   Modules accepted: Orders

## 2019-05-15 NOTE — Patient Instructions (Signed)
Medication Instructions:  °Your physician recommends that you continue on your current medications as directed. Please refer to the Current Medication list given to you today. ° °If you need a refill on your cardiac medications before your next appointment, please call your pharmacy.  ° °Lab work: °None. ° °If you have labs (blood work) drawn today and your tests are completely normal, you will receive your results only by: °• MyChart Message (if you have MyChart) OR °• A paper copy in the mail °If you have any lab test that is abnormal or we need to change your treatment, we will call you to review the results. ° °Testing/Procedures: °Your physician has requested that you have an echocardiogram. Echocardiography is a painless test that uses sound waves to create images of your heart. It provides your doctor with information about the size and shape of your heart and how well your heart’s chambers and valves are working. This procedure takes approximately one hour. There are no restrictions for this procedure. ° °Your physician has requested that you have a lexiscan myoview. For further information please visit [email or web address]. Please follow instruction sheet, as given. ° ° ° °Follow-Up: °At [facility], you and your health needs are our priority.  As part of our continuing mission to provide you with exceptional heart care, we have created designated Provider Care Teams.  These Care Teams include your primary Cardiologist (physician) and Advanced Practice Providers (APPs -  Physician Assistants and Nurse Practitioners) who all work together to provide you with the care you need, when you need it. °You will need a follow up appointment in 1 months.  Please call our office 2 months in advance to schedule this appointment.  You may see No primary care provider on file. or another member of our [facility] Provider Team in [place]: °Brian Munley, MD °• Rajan Revankar, MD ° °Any Other Special Instructions  Will Be Listed Below (If Applicable). ° ° °Echocardiogram °An echocardiogram is a procedure that uses painless sound waves (ultrasound) to produce an image of the heart. Images from an echocardiogram can provide important information about: °· Signs of coronary artery disease (CAD). °· Aneurysm detection. An aneurysm is a weak or damaged part of an artery wall that bulges out from the normal force of blood pumping through the body. °· Heart size and shape. Changes in the size or shape of the heart can be associated with certain conditions, including heart failure, aneurysm, and CAD. °· Heart muscle function. °· Heart valve function. °· Signs of a past heart attack. °· Fluid buildup around the heart. °· Thickening of the heart muscle. °· A tumor or infectious growth around the heart valves. °Tell a health care provider about: °· Any allergies you have. °· All medicines you are taking, including vitamins, herbs, eye drops, creams, and over-the-counter medicines. °· Any blood disorders you have. °· Any surgeries you have had. °· Any medical conditions you have. °· Whether you are pregnant or may be pregnant. °What are the risks? °Generally, this is a safe procedure. However, problems may occur, including: °· Allergic reaction to dye (contrast) that may be used during the procedure. °What happens before the procedure? °No specific preparation is needed. You may eat and drink normally. °What happens during the procedure? ° °· An IV tube may be inserted into one of your veins. °· You may receive contrast through this tube. A contrast is an injection that improves the quality of the pictures from your heart. °·   A gel will be applied to your chest. °· A wand-like tool (transducer) will be moved over your chest. The gel will help to transmit the sound waves from the transducer. °· The sound waves will harmlessly bounce off of your heart to allow the heart images to be captured in real-time motion. The images will be recorded  on a computer. °The procedure may vary among health care providers and hospitals. °What happens after the procedure? °· You may return to your normal, everyday life, including diet, activities, and medicines, unless your health care provider tells you not to do that. °Summary °· An echocardiogram is a procedure that uses painless sound waves (ultrasound) to produce an image of the heart. °· Images from an echocardiogram can provide important information about the size and shape of your heart, heart muscle function, heart valve function, and fluid buildup around your heart. °· You do not need to do anything to prepare before this procedure. You may eat and drink normally. °· After the echocardiogram is completed, you may return to your normal, everyday life, unless your health care provider tells you not to do that. °This information is not intended to replace advice given to you by your health care provider. Make sure you discuss any questions you have with your health care provider. °Document Released: 09/01/2000 Document Revised: 12/26/2018 Document Reviewed: 10/07/2016 °Elsevier Patient Education © 2020 Else[employer] Inc[employer] ° ° ° °Cardiac Nuclear Scan °A cardiac nuclear scan is a test that measures blood flow to the heart when a person is resting and when he or she is exercising. The test looks for problems such as: °· Not enough blood reaching a portion of the heart. °· The heart muscle not working normally. °You may need this test if: °· You have heart disease. °· You have had abnormal lab results. °· You have had heart surgery or a balloon procedure to open up blocked arteries (angioplasty). °· You have chest pain. °· You have shortness of breath. °In this test, a radioactive dye (tracer) is injected into your bloodstream. After the tracer has traveled to your heart, an imaging device is used to measure how much of the tracer is absorbed by or distributed to various areas of your heart. This procedure is usually done  at a hospital and takes 2-4 hours. °Tell a health care provider about: °· Any allergies you have. °· All medicines you are taking, including vitamins, herbs, eye drops, creams, and over-the-counter medicines. °· Any problems you or family members have had with anesthetic medicines. °· Any blood disorders you have. °· Any surgeries you have had. °· Any medical conditions you have. °· Whether you are pregnant or may be pregnant. °What are the risks? °Generally, this is a safe procedure. However, problems may occur, including: °· Serious chest pain and heart attack. This is only a risk if the stress portion of the test is done. °· Rapid heartbeat. °· Sensation of warmth in your chest. This usually passes quickly. °· Allergic reaction to the tracer. °What happens before the procedure? °· Ask your health care provider about changing or stopping your regular medicines. This is especially important if you are taking diabetes medicines or blood thinners. °· Follow instructions from your health care provider about eating or drinking restrictions. °· Remove your jewelry on the day of the procedure. °What happens during the procedure? °· An IV will be inserted into one of your veins. °· Your health care provider will inject a small amount of radioactive   tracer through the IV. °· You will wait for 20-40 minutes while the tracer travels through your bloodstream. °· Your heart activity will be monitored with an electrocardiogram (ECG). °· You will lie down on an exam table. °· Images of your heart will be taken for about 15-20 minutes. °· You may also have a stress test. For this test, one of the following may be done: °? You will exercise on a treadmill or stationary bike. While you exercise, your heart's activity will be monitored with an ECG, and your blood pressure will be checked. °? You will be given medicines that will increase blood flow to parts of your heart. This is done if you are unable to exercise. °· When blood  flow to your heart has peaked, a tracer will again be injected through the IV. °· After 20-40 minutes, you will get back on the exam table and have more images taken of your heart. °· Depending on the type of tracer used, scans may need to be repeated 3-4 hours later. °· Your IV line will be removed when the procedure is over. °The procedure may vary among health care providers and hospitals. °What happens after the procedure? °· Unless your health care provider tells you otherwise, you may return to your normal schedule, including diet, activities, and medicines. °· Unless your health care provider tells you otherwise, you may increase your fluid intake. This will help to flush the contrast dye from your body. Drink enough fluid to keep your urine pale yellow. °· Ask your health care provider, or the department that is doing the test: °? When will my results be ready? °? How will I get my results? °Summary °· A cardiac nuclear scan measures the blood flow to the heart when a person is resting and when he or she is exercising. °· Tell your health care provider if you are pregnant. °· Before the procedure, ask your health care provider about changing or stopping your regular medicines. This is especially important if you are taking diabetes medicines or blood thinners. °· After the procedure, unless your health care provider tells you otherwise, increase your fluid intake. This will help flush the contrast dye from your body. °· After the procedure, unless your health care provider tells you otherwise, you may return to your normal schedule, including diet, activities, and medicines. °This information is not intended to replace advice given to you by your health care provider. Make sure you discuss any questions you have with your health care provider. °Document Released: 09/29/2004 Document Revised: 02/18/2018 Document Reviewed: 02/18/2018 °Elsevier Patient Education © 2020 Elsevier Inc. ° °

## 2019-05-15 NOTE — Progress Notes (Signed)
Cardiology Consultation:    Date:  05/15/2019   ID:  Carol Bolton, DOB 12-05-1940, MRN [id number]  PCP:  Mayra Neer, MD  Cardiologist:  Jenne Campus, MD   Referring MD: Mayra Neer, MD   Chief Complaint  Patient presents with  . Sharp back pain    Started to radiate to the arms causing numbness    History of Present Illness:    Carol Bolton is a 78 y.o. female who is being seen today for the evaluation of chest pain at the request of Mayra Neer, MD.  Since April she had episode of chest pain with numbness in both arms.  She described 2 type of pains one is very sharp short lasting only for split-second electrical car going across the chest and noted to obtain she describes more uneasy sensation in the chest lasting only for short period of time meaning seconds, not related to exercise.  She is able to walk climb stairs with no major difficulties.  She like to stay healthy and try to exercise . Resources for coronary artery disease include dyslipidemia, hypertension, diabetes that is diet-controlled.  She never had any heart trouble.  Past Medical History:  Diagnosis Date  . Anxiety   . BBB (bundle branch block)    right bbb  . Bell's palsy   . Diabetes mellitus   . Hyperlipidemia   . Hypertension   . Neuromuscular disorder [facility])     Past Surgical History:  Procedure Laterality Date  . EXTERNAL EAR SURGERY     R ear. d/t bells palsy  . LUMBAR FUSION    . TUBAL LIGATION      Current Medications: Current Meds  Medication Sig  . Calcium Carbonate-Vit D-Min (CALCIUM 1200 PO) Take 1 tablet by mouth daily.  . cholecalciferol (VITAMIN D) 1000 UNITS tablet Take 1,000 Units by mouth daily.  . diphenhydrAMINE (BENADRYL) 25 MG tablet Take 25 mg by mouth at bedtime as needed. For allergies  . KRILL OIL PO Take 1 capsule by mouth daily.  . metoprolol (LOPRESSOR) 50 MG tablet Take 50 mg by mouth 2 (two) times daily.    . raloxifene (EVISTA) 60 MG tablet Take  60 mg by mouth daily.  . simvastatin (ZOCOR) 40 MG tablet Take 40 mg by mouth every evening.  . valsartan-hydrochlorothiazide (DIOVAN-HCT) 160-12.5 MG per tablet Take 1 tablet by mouth daily.     Allergies:   Patient has no known allergies.   Social History   Socioeconomic History  . Marital status: Married    Spouse name: Not on file  . Number of children: 2  . Years of education: Not on file  . Highest education level: Not on file  Occupational History  . Occupation: retired.     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 Financial resource strain: Not on file  . Food insecurity    Worry: Not on file    Inability: Not on file  . Transportation needs    Medical: Not on file    Non-medical: Not on file  Tobacco Use  . Smoking status: Never Smoker  . Smokeless tobacco: Never Used  Substance and Sexual Activity  . Alcohol use: No  . Drug use: No  . Sexual activity: Yes  Lifestyle  . Physical activity    Days per week: Not on file    Minutes per session: Not on file  . Stress: Not  on file  Relationships  . Social Herbalist on phone: Not on file    Gets together: Not on file    Attends religious service: Not on file    Active member of club or organization: Not on file    Attends meetings of clubs or organizations: Not on file    Relationship status: Not on file  Other Topics Concern  . Not on file  Social History Narrative  . Not on file     Family History: The patient's.  Does have dyslipidemia ROS:   Please see the history of present illness.    All 14 point review of systems negative except as described per history of present illness.  EKGs/Labs/Other Studies Reviewed:    The following studies were reviewed today:   EKG:  EKG is  ordered today.  The ekg ordered today demonstrates normal sinus rhythm, normal P interval, right bundle branch block  Recent Labs: No results found for requested labs within  last 8760 hours.  Recent Lipid Panel No results found for: CHOL, TRIG, HDL, CHOLHDL, VLDL, LDLCALC, LDLDIRECT  Physical Exam:    VS:  BP (!) 146/78   Pulse 65   Ht 5' 2.5" (1.588 m)   Wt 162 lb 6.4 oz (73.7 kg)   SpO2 95%   BMI 29.23 kg/m     Wt Readings from Last 3 Encounters:  05/15/19 162 lb 6.4 oz (73.7 kg)  06/27/12 134 lb 14.4 oz (61.2 kg)  03/13/12 132 lb (59.9 kg)     GEN:  Well nourished, well developed in no acute distress HEENT: Normal NECK: No JVD; No carotid bruits LYMPHATICS: No lymphadenopathy CARDIAC: RRR, no murmurs, no rubs, no gallops RESPIRATORY:  Clear to auscultation without rales, wheezing or rhonchi  ABDOMEN: Soft, non-tender, non-distended MUSCULOSKELETAL:  No edema; No deformity  SKIN: Warm and dry NEUROLOGIC:  Alert and oriented x 3 PSYCHIATRIC:  Normal affect   ASSESSMENT:    1. Mixed hyperlipidemia   2. Atypical chest pain   3. Essential hypertension   4. BBB (bundle branch block)    PLAN:    In order of problems listed above:  1. Atypical chest pain.  We talked in length about what to do with the situation.  She is already on aspirin.  Atypical characteristic of the pain is duration as well as circumstances in which pain happened.  She is does not get any shortness of breath or sweating associated with this sensation.  Still I think we decided to look at her coronary arteries make sure she does not have any significant coronary disease.  Will schedule her to have [place].  As a part of evaluation I will do echocardiogram especially in view of the fact that she does have right bundle branch block. 2. Mixed dyslipidemia.  Her fasting lipid profile last number I have is from year ago HDL was 64 and LDL 69.  It is a good cholesterol profile. 3. Essential hypertension blood pressure to be elevated but she said that she is very nervous always when she comes to doctor for history of high blood pressure.  At home 120/70. 4. Right bundle branch  block probably related to chronic lung condition.  Echocardiogram will be done to clarify that.   Medication Adjustments/Labs and Tests Ordered: Current medicines are reviewed at length with the patient today.  Concerns regarding medicines are outlined above.  No orders of the defined types were placed in this encounter.  No  orders of the defined types were placed in this encounter.   Signed, Park Liter, MD, [facility]. 05/15/2019 11:19 AM    [place]

## 2019-05-15 NOTE — Telephone Encounter (Signed)
Patient given detailed instructions per Myocardial Perfusion Study Information Sheet for the test on 05/19/19 at 7:30. Patient notified to arrive 15 minutes early and that it is imperative to arrive on time for appointment to keep from having the test rescheduled.  If you need to cancel or reschedule your appointment, please call the office within 24 hours of your appointment. . Patient verbalized understanding.Carol Bolton

## 2019-05-19 ENCOUNTER — Encounter (HOSPITAL_COMMUNITY): Payer: Medicare Other

## 2019-05-19 ENCOUNTER — Other Ambulatory Visit: Payer: Self-pay

## 2019-05-19 ENCOUNTER — Ambulatory Visit (HOSPITAL_BASED_OUTPATIENT_CLINIC_OR_DEPARTMENT_OTHER): Payer: Medicare Other

## 2019-05-19 VITALS — Wt 162.0 lb

## 2019-05-19 DIAGNOSIS — R0789 Other chest pain: Secondary | ICD-10-CM | POA: Diagnosis present

## 2019-05-19 DIAGNOSIS — I454 Nonspecific intraventricular block: Secondary | ICD-10-CM

## 2019-05-19 LAB — MYOCARDIAL PERFUSION IMAGING
LV dias vol: 52 mL (ref 46–106)
LV sys vol: 8 mL
Peak HR: 121 {beats}/min
Rest HR: 68 {beats}/min
SDS: 1
SRS: 0
SSS: 1
TID: 0.86

## 2019-05-19 MED ORDER — TECHNETIUM TC 99M TETROFOSMIN IV KIT
10.6000 | PACK | Freq: Once | INTRAVENOUS | Status: AC | PRN
  Administered 2019-05-19: 10.6 via INTRAVENOUS
  Filled 2019-05-19: qty 11

## 2019-05-19 MED ORDER — TECHNETIUM TC 99M TETROFOSMIN IV KIT
31.4000 | PACK | Freq: Once | INTRAVENOUS | Status: AC | PRN
  Administered 2019-05-19: 31.4 via INTRAVENOUS
  Filled 2019-05-19: qty 32

## 2019-05-19 MED ORDER — REGADENOSON 0.4 MG/5ML IV SOLN
0.4000 mg | Freq: Once | INTRAVENOUS | Status: AC
  Administered 2019-05-19: 0.4 mg via INTRAVENOUS

## 2019-05-20 ENCOUNTER — Ambulatory Visit (HOSPITAL_BASED_OUTPATIENT_CLINIC_OR_DEPARTMENT_OTHER)
Admission: RE | Admit: 2019-05-20 | Discharge: 2019-05-20 | Disposition: A | Discharge status: Home / Self Care | Payer: Medicare Other | Source: Ambulatory Visit | Attending: Cardiology | Admitting: Cardiology

## 2019-05-20 DIAGNOSIS — R0789 Other chest pain: Secondary | ICD-10-CM | POA: Diagnosis not present

## 2019-05-20 DIAGNOSIS — I454 Nonspecific intraventricular block: Secondary | ICD-10-CM | POA: Diagnosis not present

## 2019-05-20 NOTE — Progress Notes (Signed)
  Echocardiogram 2D Echocardiogram has been performed.  Cardell Peach 05/20/2019, 9:22 AM

## 2019-06-06 ENCOUNTER — Other Ambulatory Visit: Payer: Self-pay

## 2019-06-06 ENCOUNTER — Ambulatory Visit
Admission: RE | Admit: 2019-06-06 | Discharge: 2019-06-06 | Disposition: A | Discharge status: Home / Self Care | Payer: Medicare Other | Source: Ambulatory Visit | Attending: Family Medicine | Admitting: Family Medicine

## 2019-06-06 DIAGNOSIS — Z1231 Encounter for screening mammogram for malignant neoplasm of breast: Secondary | ICD-10-CM

## 2019-06-12 ENCOUNTER — Encounter: Payer: Self-pay | Admitting: Family

## 2019-06-12 ENCOUNTER — Other Ambulatory Visit: Payer: Self-pay

## 2019-06-12 ENCOUNTER — Ambulatory Visit (INDEPENDENT_AMBULATORY_CARE_PROVIDER_SITE_OTHER): Payer: Medicare Other | Admitting: Family

## 2019-06-12 VITALS — BP 136/82 | HR 66 | Ht 62.5 in | Wt 162.4 lb

## 2019-06-12 DIAGNOSIS — E782 Mixed hyperlipidemia: Secondary | ICD-10-CM

## 2019-06-12 DIAGNOSIS — I454 Nonspecific intraventricular block: Secondary | ICD-10-CM | POA: Diagnosis not present

## 2019-06-12 DIAGNOSIS — I10 Essential (primary) hypertension: Secondary | ICD-10-CM

## 2019-06-12 DIAGNOSIS — I519 Heart disease, unspecified: Secondary | ICD-10-CM | POA: Diagnosis not present

## 2019-06-12 NOTE — Patient Instructions (Addendum)
Medication Instructions:  No medication changes today.   If you need a refill on your cardiac medications before your next appointment, please call your pharmacy.   Lab work: No lab work today.   If you have labs (blood work) drawn today and your tests are completely normal, you will receive your results only by: Marland Kitchen MyChart Message (if you have MyChart) OR . A paper copy in the mail If you have any lab test that is abnormal or we need to change your treatment, we will call you to review the results.  Testing/Procedures: None ordered today.   We reviewed your stress test and echocardiogram during your office visit today.  Your stress test was low risk which means there was no evidence of ischemia or blockage.  Your echo showed good heart pumping function, mild stiffness likely due to age, but no concerning findings.  Follow-Up: At [facility], you and your health needs are our priority.  As part of our continuing mission to provide you with exceptional heart care, we have created designated Provider Care Teams.  These Care Teams include your primary Cardiologist (physician) and Advanced Practice Providers (APPs -  Physician Assistants and Nurse Practitioners) who all work together to provide you with the care you need, when you need it. You will need a follow up appointment in 1 years.  You may see Jenne Campus, MD or another member of our [place] Provider Team in [place]: Shirlee More, MD . Jyl Heinz, MD . Berniece Salines, MD . Laurann Montana, NP  Any Other Special Instructions Will Be Listed Below (If Applicable).  It was a pleasure to meet you today!   You have a known right bundle branch block (RBBB) on your EKG. This is not a new finding, but one you should be aware of. There are 3 main electrical pathways in your heart - 2 on the left and 1 on the right. Your RBBB tells [place] that one of your electrical pathways is blocked. Please report new onset dizziness,  lightheadedness, syncope.    Your heart tests were good! These were reassuring results. Your Metoprolol and Diovan are good medications to help protect your heart.    Right Bundle Branch Block  Right bundle branch block (RBBB) is a problem with the way that electrical impulses pass through the heart (electrical conduction abnormality). The heart depends on an electrical pulse to beat normally. The electrical signal for a heartbeat starts in the upper chambers of the heart (atria) and then travels to the two lower chambers (left and rightventricles). An RBBB is a partial or complete block of the pathway that carries the signal to the right ventricle. If you have RBBB, the right side of your heart beats a little more slowly than the left side. RBBB may be a warning of heart disease or a lung problem. What are the causes? This condition may be caused by:  Heart attack (myocardial infarction).  Being born with a heart defect (congenital heart disease).  A blood clot that flows into the lung (pulmonary embolism).  Infection of heart muscle (myocarditis).  High blood pressure (hypertension). In some cases, the cause may not be known. What increases the risk? The following factors may make you more likely to develop this condition:  Being female.  Being 75 years of age or older.  Having heart disease.  Having had a heart attack or heart surgery. What are the signs or symptoms? This condition does not typically cause symptoms. How  is this diagnosed? This condition may be diagnosed based on an electrocardiogram (ECG). It is often diagnosed when an ECG is done as part of a routine physical. You may also have imaging tests to find out more about your condition. These may include:  Chest X-rays.  Echocardiogram. How is this treated? Treatment may not be needed for this condition if you do not have symptoms or any other heart problems. However, you may need to see your health care  provider more often because RBBB can be a warning sign of future heart or lung problems. You may need treatment for another condition that may be causing RBBB. If other forms of heart block are present and you are having symptoms, a pacemaker is sometimes necessary. Follow these instructions at home:  Follow instructions from your health care provider about eating or drinking restrictions.  Take over-the-counter and prescription medicines only as told by your health care provider.  Return to your normal activities as told by your health care provider. Ask your health care provider what activities are safe for you.  Follow a heart-healthy diet and maintain a healthy weight. Work with a diet and nutrition specialist (dietitian) to create an eating plan that is best for you.  Get regular exercise as told by your health care provider.  Do not use any products that contain nicotine or tobacco, such as cigarettes and e-cigarettes. If you need help quitting, ask your health care provider.  Keep all follow-up visits as told by your health care provider. This is important. Contact a health care provider if:  You are light-headed or you faint. Get help right away if:  You have chest pain.  You have difficulty breathing. These symptoms may represent a serious problem that is an emergency. Do not wait to see if the symptoms will go away. Get medical help right away. Call your local emergency services (911 in the [place] Do not drive yourself to the hospital.  Summary  For the heart to beat normally, an electrical signal must travel to the heart's lower right chamber (right ventricle). Right bundle branch block (RBBB) is a partial or complete block of the pathway that carries that signal.  This condition does not typically cause symptoms.  Treatment may not be needed for RBBB if you do not have symptoms or any other heart problems.  You may need to see your health care provider more often because  RBBB can be a warning sign of future heart or lung problems. This information is not intended to replace advice given to you by your health care provider. Make sure you discuss any questions you have with your health care provider. Document Released: 02/15/2017 Document Revised: 08/17/2017 Document Reviewed: 02/15/2017 Elsevier Patient Education  2020 [employer].

## 2019-06-12 NOTE — Progress Notes (Signed)
Office Visit    Patient Name: Carol Bolton Date of Encounter: 06/12/2019  Primary Care Provider:  Mayra Neer, MD Primary Cardiologist:  Jenne Campus, MD Electrophysiologist:  None   Chief Complaint    Carol Bolton is a 78 y.o. female with a hx of chest pain, RBBB, HTN, HLD, DM2 presents today for follow up after recent echocardiogram.   Past Medical History    Past Medical History:  Diagnosis Date  . Anxiety   . BBB (bundle branch block)    right bbb  . Bell's palsy   . Diabetes mellitus   . Hyperlipidemia   . Hypertension   . Neuromuscular disorder [facility])    Past Surgical History:  Procedure Laterality Date  . EXTERNAL EAR SURGERY     R ear. d/t bells palsy  . LUMBAR FUSION    . TUBAL LIGATION     Allergies  No Known Allergies  History of Present Illness    Carol Bolton is a 78 y.o. female with a hx of chest pain, RBBB, HTN, HLD, DM2 last seen by Dr. Agustin Cree 05/15/19. She was seen in consult for atypical chest pain and recommended for Lexiscan and echocardiogram. Her lexiscan was low risk with no evidence ischemia nor previous infarction. Her echocardiogram showed EF 55-60%, impaired relaxation, RV normal size function, no significant valvular abnormalities.   She reports feeling well. Tells me her home blood pressures are routinely [id number] systolic. She reports intermittent chest "twinges" that last a few seconds, have no noted aggravating factors, and self-resolve. Tells me she thinks they are muscle spasms. Endorses intermittent L arm pain that is associated with pain in her back and feelings like a "numbness". She was encouraged to follow up with her PCP.  EKGs/Labs/Other Studies Reviewed:   The following studies were reviewed today:  Lexiscan 05/19/19  Nuclear stress EF: 85%. The left ventricular ejection fraction is hyperdynamic (>65%).  This is a low risk study. No evidence of ischemia or previous infarction  The study is  normal.   Echo 05/20/19 1. The left ventricle has normal systolic function, with an ejection fraction of 55-60%. The cavity size was normal. Left ventricular diastolic Doppler parameters are consistent with impaired relaxation. No evidence of left ventricular regional wall  motion abnormalities.  2. The right ventricle has normal systolic function. The cavity was normal. There is no increase in right ventricular wall thickness.  3. The mitral valve is degenerative. Mild thickening of the mitral valve leaflet. No evidence of mitral valve stenosis.  4. The aortic valve is tricuspid. Mild sclerosis of the aortic valve. No stenosis of the aortic valve.  5. The aortic root and ascending aorta are normal in size and structure.  EKG:  No EKG today. EKG reviewed from 05/15/19 SR rate 60bpm with RBBB.   Recent Labs: 12/31/18 via [facility]: ALT 13, AST 20, creatinine 1.22, Hb 13.5, K 4.2  Recent Lipid Panel 04/2018 via [facility]: total cholesterol 156, HDL 64, LDL 69, triglycerides 115  Home Medications   Current Meds  Medication Sig  . Calcium Carbonate-Vit D-Min (CALCIUM 1200 PO) Take 1 tablet by mouth daily.  . cholecalciferol (VITAMIN D) 1000 UNITS tablet Take 1,000 Units by mouth daily.  . diphenhydrAMINE (BENADRYL) 25 MG tablet Take 25 mg by mouth at bedtime as needed. For allergies  . KRILL OIL PO Take 1 capsule by mouth daily.  . metoprolol (LOPRESSOR) 50 MG tablet Take 50 mg by mouth 2 (two) times daily.    Marland Kitchen  raloxifene (EVISTA) 60 MG tablet Take 60 mg by mouth daily.  . simvastatin (ZOCOR) 40 MG tablet Take 40 mg by mouth every evening.  . valsartan-hydrochlorothiazide (DIOVAN-HCT) 160-12.5 MG per tablet Take 1 tablet by mouth daily.    Review of Systems      Review of Systems  Constitution: Negative for chills, fever and malaise/fatigue.  Cardiovascular: Negative for chest pain, dyspnea on exertion, irregular heartbeat, leg swelling and near-syncope.  Respiratory: Positive for cough (chronic).  Negative for shortness of breath and wheezing.   Gastrointestinal: Negative for nausea and vomiting.  Neurological: Negative for dizziness, light-headedness and weakness.   All other systems reviewed and are otherwise negative except as noted above.  Physical Exam    VS:  BP 136/82 (BP Location: Left Arm, Patient Position: Sitting, Cuff Size: Normal)   Pulse 66   Ht 5' 2.5" (1.588 m)   Wt 162 lb 6.4 oz (73.7 kg)   SpO2 95%   BMI 29.23 kg/m  , BMI Body mass index is 29.23 kg/m. GEN: Well nourished, well developed, in no acute distress. HEENT: normal. Neck: Supple, no JVD, carotid bruits, or masses. Cardiac: RRR, no murmurs, rubs, or gallops. No clubbing, cyanosis, edema.  Radials/DP/PT 2+ and equal bilaterally.  Respiratory:  Respirations regular and unlabored, clear to auscultation bilaterally. GI: Soft, nontender, nondistended, BS + x 4. MS: No deformity or atrophy. Skin: Warm and dry, no rash. Neuro:  Strength and sensation are intact. Psych: Normal affect.  Assessment & Plan    1. Chest pain - Intermittent chest "twinges" that last a few seconds, self resolve. Stress test 05/19/19 low risk with no evidence of ischemia. Echo 05/20/19 with EF 55-60%, impaired relaxation, no significant valvular abnormalities. Reassurance that these "twinges" are not related to coronary artery disease.    2. HTN - Home SBP 120s-130s. Continue present anti-hypertensive regimen. She will report if SBP consistently >130. Goal BP <130/80.   3. HLD -  Lipid panel 04/2018 with LDL 69 and HDL 64. Continue present statin.   4. RBBB - Present for many years. Education provided to report new onset lightheadedness, near-syncope, syncope.   5. Impaired LV relaxation - Noted on echo 05/20/19. Normal finding in her age range. She denies worsening DOE, no edema. She is euvolemic on exam. No indication for diuretic. Continue GDMT of beta blocker and ARB.   Disposition: Follow up in 1 year(s) with Dr. Agustin Cree.    Loel Dubonnet, NP 06/12/2019, 11:21 AM

## 2019-06-19 ENCOUNTER — Ambulatory Visit: Payer: Medicare Other | Admitting: Cardiology

## 2019-11-07 ENCOUNTER — Ambulatory Visit: Payer: Medicare Other

## 2019-11-09 ENCOUNTER — Ambulatory Visit: Payer: Medicare Other | Attending: Internal Medicine

## 2019-11-09 DIAGNOSIS — Z23 Encounter for immunization: Secondary | ICD-10-CM

## 2019-11-09 NOTE — Progress Notes (Signed)
   Covid-19 Vaccination Clinic  Name:  Carol Bolton    MRN: [id number] DOB: 15-Mar-1941  11/09/2019  [name] was observed post Covid-19 immunization for 15 minutes without incidence. She was provided with Vaccine Information Sheet and instruction to access the V-Safe system.   [name] was instructed to call 911 with any severe reactions post vaccine: Marland Kitchen Difficulty breathing  . Swelling of your face and throat  . A fast heartbeat  . A bad rash all over your body  . Dizziness and weakness    Immunizations Administered    Name Date Dose VIS Date Route   Pfizer COVID-19 Vaccine 11/09/2019  9:32 AM 0.3 mL 08/29/2019 Intramuscular   Manufacturer: [place]   Lot: [id number]   [place]: [id number]

## 2019-12-03 ENCOUNTER — Ambulatory Visit: Payer: Medicare Other | Attending: Internal Medicine

## 2019-12-03 DIAGNOSIS — Z23 Encounter for immunization: Secondary | ICD-10-CM

## 2019-12-03 NOTE — Progress Notes (Signed)
   Covid-19 Vaccination Clinic  Name:  Carol Bolton    MRN: [id number] DOB: [birth year]  12/03/2019  Carol Bolton was observed post Covid-19 immunization for 15 minutes without incident. She was provided with Vaccine Information Sheet and instruction to access the V-Safe system.   Carol Bolton was instructed to call 911 with any severe reactions post vaccine: Marland Kitchen Difficulty breathing  . Swelling of face and throat  . A fast heartbeat  . A bad rash all over body  . Dizziness and weakness   Immunizations Administered    Name Date Dose VIS Date Route   Pfizer COVID-19 Vaccine 12/03/2019  9:30 AM 0.3 mL 08/29/2019 Intramuscular   Manufacturer: [place]   Lot: [id number]   [place]: [id number]

## 2020-05-13 ENCOUNTER — Other Ambulatory Visit: Payer: Self-pay | Admitting: Family Medicine

## 2020-05-13 DIAGNOSIS — Z1231 Encounter for screening mammogram for malignant neoplasm of breast: Secondary | ICD-10-CM

## 2020-06-07 ENCOUNTER — Ambulatory Visit
Admission: RE | Admit: 2020-06-07 | Discharge: 2020-06-07 | Disposition: A | Discharge status: Home / Self Care | Payer: Medicare Other | Source: Ambulatory Visit | Attending: Family Medicine | Admitting: Family Medicine

## 2020-06-07 ENCOUNTER — Other Ambulatory Visit: Payer: Self-pay

## 2020-06-07 DIAGNOSIS — Z1231 Encounter for screening mammogram for malignant neoplasm of breast: Secondary | ICD-10-CM

## 2020-07-23 ENCOUNTER — Other Ambulatory Visit: Payer: Self-pay | Admitting: Family Medicine

## 2020-07-23 DIAGNOSIS — M8580 Other specified disorders of bone density and structure, unspecified site: Secondary | ICD-10-CM

## 2020-09-09 DIAGNOSIS — G510 Bell's palsy: Secondary | ICD-10-CM | POA: Insufficient documentation

## 2020-09-09 DIAGNOSIS — G709 Myoneural disorder, unspecified: Secondary | ICD-10-CM | POA: Insufficient documentation

## 2020-09-13 ENCOUNTER — Encounter: Payer: Self-pay | Admitting: Cardiology

## 2020-09-13 ENCOUNTER — Other Ambulatory Visit: Payer: Self-pay

## 2020-09-13 ENCOUNTER — Ambulatory Visit: Payer: Medicare Other | Admitting: Cardiology

## 2020-09-13 VITALS — BP 124/74 | HR 56 | Ht 62.0 in | Wt 162.0 lb

## 2020-09-13 DIAGNOSIS — R0789 Other chest pain: Secondary | ICD-10-CM

## 2020-09-13 DIAGNOSIS — E782 Mixed hyperlipidemia: Secondary | ICD-10-CM | POA: Diagnosis not present

## 2020-09-13 DIAGNOSIS — I10 Essential (primary) hypertension: Secondary | ICD-10-CM | POA: Diagnosis not present

## 2020-09-13 DIAGNOSIS — I454 Nonspecific intraventricular block: Secondary | ICD-10-CM | POA: Diagnosis not present

## 2020-09-13 NOTE — Patient Instructions (Signed)

## 2020-09-13 NOTE — Progress Notes (Signed)
Cardiology Office Note:    Date:  09/13/2020   ID:  Carol Bolton, DOB Mar 31, 1941, MRN [id number]  PCP:  Mayra Neer, MD  Cardiologist:  Jenne Campus, MD    Referring MD: Mayra Neer, MD   Chief Complaint  Patient presents with  . Follow-up  Vomiting fine  History of Present Illness:    Carol Bolton is a 79 y.o. female who was referred to [place] because of atypical chest pain.  She was also find to have right bundle branch block.  Echocardiogram stress test done thereafter showed no issues.  She comes today to my office for follow-up.  Denies have any chest pain except for some twinges in the chest and left arm which happen at rest.  No tightness squeezing pressure burning chest.  She is taking care of her grandchildren she is very busy driving back to school and back and playing with them and have no difficulty doing it.  Overall she seems to be active and doing well.  Past Medical History:  Diagnosis Date  . Anxiety   . Atypical chest pain 05/15/2019  . BBB (bundle branch block)    right bbb  . Bell's palsy   . Bronchiectasis 11/15/2011   CT chest 09/2011: Lower lobe infiltrates that I suspect has some degree of chronicity and associated with bronchiectasis Spirometry 2013: normal  Sputum CS 2013:  Strep pneumoniae.   . Chronic sinusitis 11/15/2011   Sinus surgery 2013   . Cough 03/13/2012  . Diabetes mellitus   . Diabetes mellitus ([place]) 11/14/2011  . Hyperlipidemia   . Hypertension   . Neuromuscular disorder ([place])   . Synovial cyst of lumbar facet joint 06/24/2012    Past Surgical History:  Procedure Laterality Date  . EXTERNAL EAR SURGERY     R ear. d/t bells palsy  . LUMBAR FUSION    . TUBAL LIGATION      Current Medications: Current Meds  Medication Sig  . aspirin 81 MG chewable tablet Chew 1 tablet by mouth daily.  . Calcium Carbonate-Vit D-Min (CALCIUM 1200) 1200-1000 MG-UNIT CHEW Chew 1 tablet by mouth daily.  . Cetirizine HCl (ZYRTEC  ALLERGY) 10 MG CAPS Take 1 tablet by mouth as needed.  . cholecalciferol (VITAMIN D) 1000 UNITS tablet Take 1,000 Units by mouth daily.  . diphenhydrAMINE (BENADRYL) 25 MG tablet Take 25 mg by mouth at bedtime as needed. For allergies  . guaiFENesin (MUCINEX) 600 MG 12 hr tablet Take 60 mg by mouth as needed.  Marland Kitchen KRILL OIL PO Take 1 capsule by mouth daily.  . metoprolol (LOPRESSOR) 50 MG tablet Take 50 mg by mouth 2 (two) times daily.  . Probiotic Product (PROBIOTIC DAILY PO) Take 1 tablet by mouth daily.  . raloxifene (EVISTA) 60 MG tablet Take 60 mg by mouth daily.  . simvastatin (ZOCOR) 40 MG tablet Take 40 mg by mouth every evening.  . valsartan-hydrochlorothiazide (DIOVAN-HCT) 320-25 MG tablet Take 1 tablet by mouth daily.  . [DISCONTINUED] cholecalciferol (VITAMIN D3) 25 MCG (1000 UNIT) tablet Take 1 tablet by mouth daily.  . [DISCONTINUED] Omega-3 Fatty Acids (FISH OIL) 1000 MG CAPS Take 1 tablet by mouth daily.  . [DISCONTINUED] valsartan-hydrochlorothiazide (DIOVAN-HCT) 160-12.5 MG per tablet Take 1 tablet by mouth daily.     Allergies:   Patient has no known allergies.   Social History   Socioeconomic History  . Marital status: Married    Spouse name: Not on file  . Number of children: 2  .  Years of education: Not on file  . Highest education level: Not on file  Occupational History  . Occupation: retired.     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 Smoking status: Never Smoker  . Smokeless tobacco: Never Used  Vaping Use  . Vaping Use: Never used  Substance and Sexual Activity  . Alcohol use: No  . Drug use: No  . Sexual activity: Yes  Other Topics Concern  . Not on file  Social History Narrative  . Not on file   Social Determinants of Health   Financial Resource Strain: Not on file  Food Insecurity: Not on file  Transportation Needs: Not on file  Physical Activity: Not on file  Stress: Not on file  Social  Connections: Not on file     Family History: The patient's family history includes Allergies in her father; Asthma in her son. ROS:   Please see the history of present illness.    All 14 point review of systems negative except as described per history of present illness  EKGs/Labs/Other Studies Reviewed:    EKG showed normal sinus rhythm, right bundle branch block.  Recent Labs: No results found for requested labs within last 8760 hours.  Recent Lipid Panel No results found for: CHOL, TRIG, HDL, CHOLHDL, VLDL, LDLCALC, LDLDIRECT  Physical Exam:    VS:  BP 124/74 (BP Location: Right Arm, Patient Position: Sitting)   Pulse (!) 56   Ht 5\' 2"  (1.575 m)   Wt 162 lb (73.5 kg)   SpO2 94%   BMI 29.63 kg/m     Wt Readings from Last 3 Encounters:  09/13/20 162 lb (73.5 kg)  06/12/19 162 lb 6.4 oz (73.7 kg)  05/19/19 162 lb (73.5 kg)     GEN:  Well nourished, well developed in no acute distress HEENT: Normal NECK: No JVD; No carotid bruits LYMPHATICS: No lymphadenopathy CARDIAC: RRR, no murmurs, no rubs, no gallops RESPIRATORY:  Clear to auscultation without rales, wheezing or rhonchi  ABDOMEN: Soft, non-tender, non-distended MUSCULOSKELETAL:  No edema; No deformity  SKIN: Warm and dry LOWER EXTREMITIES: no swelling NEUROLOGIC:  Alert and oriented x 3 PSYCHIATRIC:  Normal affect   ASSESSMENT:    1. Primary hypertension   2. Atypical chest pain   3. Mixed hyperlipidemia   4. BBB (bundle branch block)    PLAN:    In order of problems listed above:  1. Essential hypertension blood pressure well controlled continue present management. 2. Atypical chest pain stress test from 2020 reviewed showed no evidence of ischemia we will continue conservative risk factors modifications. 3. Mixed dyslipidemia I did review her K PN which showed me her LDL of 50 HDL 71 this is with simvastatin  which I will continue. 4. Right bundle branch block.  Noted.  Normal left ventricle ejection  fraction normal right ventricle size   Medication Adjustments/Labs and Tests Ordered: Current medicines are reviewed at length with the patient today.  Concerns regarding medicines are outlined above.  Orders Placed This Encounter  Procedures  . EKG 12-Lead   Medication changes: No orders of the defined types were placed in this encounter.   Signed, Park Liter, MD, [facility] 09/13/2020 11:12 AM    [place]

## 2020-10-06 DIAGNOSIS — Z01812 Encounter for preprocedural laboratory examination: Secondary | ICD-10-CM | POA: Diagnosis not present

## 2020-10-07 DIAGNOSIS — L918 Other hypertrophic disorders of the skin: Secondary | ICD-10-CM | POA: Diagnosis not present

## 2020-10-07 DIAGNOSIS — Z85828 Personal history of other malignant neoplasm of skin: Secondary | ICD-10-CM | POA: Diagnosis not present

## 2020-10-07 DIAGNOSIS — C44519 Basal cell carcinoma of skin of other part of trunk: Secondary | ICD-10-CM | POA: Diagnosis not present

## 2020-10-07 DIAGNOSIS — L570 Actinic keratosis: Secondary | ICD-10-CM | POA: Diagnosis not present

## 2020-10-07 DIAGNOSIS — D2372 Other benign neoplasm of skin of left lower limb, including hip: Secondary | ICD-10-CM | POA: Diagnosis not present

## 2020-10-07 DIAGNOSIS — L821 Other seborrheic keratosis: Secondary | ICD-10-CM | POA: Diagnosis not present

## 2020-10-07 DIAGNOSIS — L649 Androgenic alopecia, unspecified: Secondary | ICD-10-CM | POA: Diagnosis not present

## 2020-10-07 DIAGNOSIS — L814 Other melanin hyperpigmentation: Secondary | ICD-10-CM | POA: Diagnosis not present

## 2020-10-11 DIAGNOSIS — K5520 Angiodysplasia of colon without hemorrhage: Secondary | ICD-10-CM | POA: Diagnosis not present

## 2020-10-11 DIAGNOSIS — K640 First degree hemorrhoids: Secondary | ICD-10-CM | POA: Diagnosis not present

## 2020-10-11 DIAGNOSIS — Z86010 Personal history of colonic polyps: Secondary | ICD-10-CM | POA: Diagnosis not present

## 2020-10-11 DIAGNOSIS — K5730 Diverticulosis of large intestine without perforation or abscess without bleeding: Secondary | ICD-10-CM | POA: Diagnosis not present

## 2020-10-11 DIAGNOSIS — K644 Residual hemorrhoidal skin tags: Secondary | ICD-10-CM | POA: Diagnosis not present

## 2020-11-05 ENCOUNTER — Other Ambulatory Visit: Payer: Medicare Other

## 2021-02-10 DIAGNOSIS — Z03818 Encounter for observation for suspected exposure to other biological agents ruled out: Secondary | ICD-10-CM | POA: Diagnosis not present

## 2021-02-10 DIAGNOSIS — J0100 Acute maxillary sinusitis, unspecified: Secondary | ICD-10-CM | POA: Diagnosis not present

## 2021-04-05 ENCOUNTER — Ambulatory Visit
Admission: RE | Admit: 2021-04-05 | Discharge: 2021-04-05 | Disposition: A | Discharge status: Home / Self Care | Payer: Medicare Other | Source: Ambulatory Visit | Attending: Family Medicine | Admitting: Family Medicine

## 2021-04-05 ENCOUNTER — Other Ambulatory Visit: Payer: Self-pay

## 2021-04-05 DIAGNOSIS — M85852 Other specified disorders of bone density and structure, left thigh: Secondary | ICD-10-CM | POA: Diagnosis not present

## 2021-04-05 DIAGNOSIS — M8580 Other specified disorders of bone density and structure, unspecified site: Secondary | ICD-10-CM

## 2021-04-05 DIAGNOSIS — Z780 Asymptomatic menopausal state: Secondary | ICD-10-CM | POA: Diagnosis not present

## 2021-04-07 DIAGNOSIS — L309 Dermatitis, unspecified: Secondary | ICD-10-CM | POA: Diagnosis not present

## 2021-04-07 DIAGNOSIS — Z85828 Personal history of other malignant neoplasm of skin: Secondary | ICD-10-CM | POA: Diagnosis not present

## 2021-05-09 ENCOUNTER — Other Ambulatory Visit: Payer: Self-pay | Admitting: Family Medicine

## 2021-05-09 DIAGNOSIS — Z1231 Encounter for screening mammogram for malignant neoplasm of breast: Secondary | ICD-10-CM

## 2021-06-09 DIAGNOSIS — L309 Dermatitis, unspecified: Secondary | ICD-10-CM | POA: Diagnosis not present

## 2021-06-09 DIAGNOSIS — Z85828 Personal history of other malignant neoplasm of skin: Secondary | ICD-10-CM | POA: Diagnosis not present

## 2021-06-21 ENCOUNTER — Ambulatory Visit
Admission: RE | Admit: 2021-06-21 | Discharge: 2021-06-21 | Disposition: A | Discharge status: Home / Self Care | Payer: Medicare Other | Source: Ambulatory Visit | Attending: Family Medicine | Admitting: Family Medicine

## 2021-06-21 ENCOUNTER — Other Ambulatory Visit: Payer: Self-pay

## 2021-06-21 DIAGNOSIS — Z1231 Encounter for screening mammogram for malignant neoplasm of breast: Secondary | ICD-10-CM | POA: Diagnosis not present

## 2021-07-11 ENCOUNTER — Emergency Department (HOSPITAL_BASED_OUTPATIENT_CLINIC_OR_DEPARTMENT_OTHER)
Admission: EM | Admit: 2021-07-11 | Discharge: 2021-07-11 | Disposition: A | Discharge status: Home / Self Care | Payer: Medicare Other | Attending: Emergency Medicine | Admitting: Emergency Medicine

## 2021-07-11 ENCOUNTER — Telehealth: Payer: Self-pay | Admitting: Cardiology

## 2021-07-11 ENCOUNTER — Encounter (HOSPITAL_BASED_OUTPATIENT_CLINIC_OR_DEPARTMENT_OTHER): Payer: Self-pay

## 2021-07-11 ENCOUNTER — Other Ambulatory Visit: Payer: Self-pay

## 2021-07-11 ENCOUNTER — Emergency Department (HOSPITAL_BASED_OUTPATIENT_CLINIC_OR_DEPARTMENT_OTHER): Payer: Medicare Other

## 2021-07-11 DIAGNOSIS — R002 Palpitations: Secondary | ICD-10-CM | POA: Diagnosis not present

## 2021-07-11 DIAGNOSIS — I493 Ventricular premature depolarization: Secondary | ICD-10-CM | POA: Insufficient documentation

## 2021-07-11 DIAGNOSIS — R0789 Other chest pain: Secondary | ICD-10-CM | POA: Diagnosis not present

## 2021-07-11 DIAGNOSIS — Z79899 Other long term (current) drug therapy: Secondary | ICD-10-CM | POA: Insufficient documentation

## 2021-07-11 DIAGNOSIS — I10 Essential (primary) hypertension: Secondary | ICD-10-CM | POA: Diagnosis not present

## 2021-07-11 DIAGNOSIS — E119 Type 2 diabetes mellitus without complications: Secondary | ICD-10-CM | POA: Insufficient documentation

## 2021-07-11 DIAGNOSIS — R079 Chest pain, unspecified: Secondary | ICD-10-CM | POA: Diagnosis not present

## 2021-07-11 DIAGNOSIS — I4519 Other right bundle-branch block: Secondary | ICD-10-CM | POA: Diagnosis not present

## 2021-07-11 LAB — BASIC METABOLIC PANEL
Anion gap: 12 (ref 5–15)
BUN: 25 mg/dL — ABNORMAL HIGH (ref 8–23)
CO2: 23 mmol/L (ref 22–32)
Calcium: 9.3 mg/dL (ref 8.9–10.3)
Chloride: 102 mmol/L (ref 98–111)
Creatinine, Ser: 0.93 mg/dL (ref 0.44–1.00)
GFR, Estimated: >60 mL/min (ref >60)
Glucose, Bld: 127 mg/dL — ABNORMAL HIGH (ref 70–99)
Potassium: 3.6 mmol/L (ref 3.5–5.1)
Sodium: 137 mmol/L (ref 135–145)

## 2021-07-11 LAB — CBC
HCT: 42.8 % (ref 36.0–46.0)
Hemoglobin: 14 g/dL (ref 12.0–15.0)
MCH: 28.8 pg (ref 26.0–34.0)
MCHC: 32.7 g/dL (ref 30.0–36.0)
MCV: 88.1 fL (ref 80.0–100.0)
Platelets: 432 10*3/uL — ABNORMAL HIGH (ref 150–400)
RBC: 4.86 MIL/uL (ref 3.87–5.11)
RDW: 13.1 % (ref 11.5–15.5)
WBC: 9.7 10*3/uL (ref 4.0–10.5)
nRBC: 0 % (ref 0.0–0.2)

## 2021-07-11 LAB — TROPONIN I (HIGH SENSITIVITY): Troponin I (High Sensitivity): 7 ng/L (ref <18)

## 2021-07-11 NOTE — ED Provider Notes (Signed)
I provided a substantive portion of the care of this patient.  I personally performed the entirety of the history for this encounter.  EKG Interpretation  Date/Time:  Monday July 11 2021 13:57:25 EDT Ventricular Rate:  102 PR Interval:  158 QRS Duration: 130 QT Interval:  356 QTC Calculation: 463 R Axis:   57 Text Interpretation: Sinus tachycardia with frequent Premature ventricular complexes Right bundle branch block Abnormal ECG Confirmed by Madalyn Rob [phone]) on 07/11/2021 2:16:19 PM   Patient reports a couple days of chest discomfort with palpitations.  She gets a sensation of some mild tightness that she finds difficult to describe.  Sometimes she feels like there is a flutter in her chest.  Patient denies any associated feelings of lightheadedness dizziness or near syncope.  He denies sweating or nausea in association with symptoms.  She reports they are fairly short-lived may last a few seconds.  They are not triggered by any particular activity.  They may come on at rest.  Patient reports he did have a similar episode a couple of years ago.  Her symptoms lasted about 3 weeks and then resolved.  Patient reports she was seen at cardiology at that time and diagnostic work-up was unremarkable without any recommended specific treatment.  Patient reports she had a chronic cough for a number of years.  She reports its been evaluated including CT scans and sinus surgery.  She reports nothing really made it go away but its been the same now for many years.  Patient is alert nontoxic clinically well in appearance.  No respiratory distress.  Mental status clear.  Heart regular.  No rub murmur gallop.  Lungs have some expiratory wheeze and rub bilateral lower lung fields.  Abdomen soft nontender.  No peripheral edema calf soft nontender.  Evaluation today stable.  Old right bundle branch block.  Patient is experiencing PVCs.  These appear to be symptomatic for her.  However, no signs of syncope  near syncope or lightheadedness associated.  Patient does not endorse other ACS symptoms of nausea, diaphoresis or general weakness.  At this time I recommend close follow-up with cardiology and PCP.  Stable for discharge with careful return precautions reviewed.   Charlesetta Shanks, MD 07/11/21 [phone]

## 2021-07-11 NOTE — ED Triage Notes (Signed)
Pt c/o CP x 5 days-NAD-steady gait

## 2021-07-11 NOTE — ED Provider Notes (Signed)
[facility] EMERGENCY DEPARTMENT Provider Note   CSN: [id number] Arrival date & time: 07/11/21  1346     History Chief Complaint  Patient presents with   Chest Pain    Carol Bolton is a 80 y.o. female with a past medical history significant for atypical chest pain, right bundle branch block, anxiety who presents with 5 days of intermittent central chest pain with some radiation to low back versus association of chest pain with low back pain.  Patient denies radiation of the chest pain to the neck, the arm, nausea, vomiting, dizziness, lightheadedness.  Patient reports that she had a full cardiac work-up in December which was notable only for her right bundle branch block, her stress test was normal.  Patient has not had a history of acute coronary syndrome, does have a history of well-controlled diabetes, does not smoke cigarettes.  No family history of acute coronary syndrome.  Patient reports that her "chest pain" is more consistent with chest discomfort secondary to feeling like she is having some heart palpitations.  Patient also associates feeling slightly short of breath when this occurs.  These episodes are random, not associated with activity.   Chest Pain Associated symptoms: palpitations and shortness of breath       Past Medical History:  Diagnosis Date   Anxiety    Atypical chest pain 05/15/2019   BBB (bundle branch block)    right bbb   Bell's palsy    Bronchiectasis 11/15/2011   CT chest 09/2011: Lower lobe infiltrates that I suspect has some degree of chronicity and associated with bronchiectasis Spirometry 2013: normal  Sputum CS 2013:  Strep pneumoniae.    Chronic sinusitis 11/15/2011   Sinus surgery 2013    Cough 03/13/2012   Diabetes mellitus    Diabetes mellitus ([place]) 11/14/2011   Hyperlipidemia    Hypertension    Neuromuscular disorder (HCC)    Synovial cyst of lumbar facet joint 06/24/2012    Patient Active Problem List   Diagnosis Date  Noted   Neuromuscular disorder [facility])    Bell's palsy    Atypical chest pain 05/15/2019   Synovial cyst of lumbar facet joint 06/24/2012   Cough 03/13/2012   Chronic sinusitis 11/15/2011   Bronchiectasis 11/15/2011   Hypertension 11/14/2011   Hyperlipidemia 11/14/2011   Diabetes mellitus ([place]) 11/14/2011   Anxiety 11/14/2011   BBB (bundle branch block) 11/14/2011    Past Surgical History:  Procedure Laterality Date   EXTERNAL EAR SURGERY     R ear. d/t bells palsy   LUMBAR FUSION     TUBAL LIGATION       OB History   No obstetric history on file.     Family History  Problem Relation Age of Onset   Allergies Father        hayfever   Asthma Son        as a child    Social History   Tobacco Use   Smoking status: Never   Smokeless tobacco: Never  Vaping Use   Vaping Use: Never used  Substance Use Topics   Alcohol use: No   Drug use: No    Home Medications Prior to Admission medications   Medication Sig Start Date End Date Taking? Authorizing Provider  aspirin 81 MG chewable tablet Chew 1 tablet by mouth daily.    [provider]  Calcium Carbonate-Vit D-Min (CALCIUM 1200) 1200-1000 MG-UNIT CHEW Chew 1 tablet by mouth daily.    [provider]  Cetirizine HCl (ZYRTEC ALLERGY) 10 MG CAPS Take 1 tablet by mouth as needed. 03/27/11   [provider]  cholecalciferol (VITAMIN D) 1000 UNITS tablet Take 1,000 Units by mouth daily.    [provider]  diphenhydrAMINE (BENADRYL) 25 MG tablet Take 25 mg by mouth at bedtime as needed. For allergies    [provider]  guaiFENesin (MUCINEX) 600 MG 12 hr tablet Take 60 mg by mouth as needed. 08/24/16   [provider]  KRILL OIL PO Take 1 capsule by mouth daily.    [provider]  metoprolol (LOPRESSOR) 50 MG tablet Take 50 mg by mouth 2 (two) times daily.    [provider]  Probiotic Product (PROBIOTIC DAILY PO) Take 1 tablet by mouth daily.    [provider]  raloxifene (EVISTA) 60 MG tablet Take 60 mg by mouth daily. 04/27/19   [provider]  simvastatin (ZOCOR) 40 MG tablet Take 40 mg by mouth every evening.    [provider]  valsartan-hydrochlorothiazide (DIOVAN-HCT) 320-25 MG tablet Take 1 tablet by mouth daily.    [provider]    Allergies    Patient has no known allergies.  Review of Systems   Review of Systems  Respiratory:  Positive for shortness of breath.   Cardiovascular:  Positive for chest pain and palpitations.  All other systems reviewed and are negative.  Physical Exam Updated Vital Signs BP (!) 123/51 (BP Location: Right Arm)   Pulse 90   Temp 97.9 F (36.6 C) (Oral)   Resp 18   SpO2 95%   Physical Exam Vitals and nursing note reviewed.  Constitutional:      General: She is not in acute distress.    Appearance: Normal appearance.  HENT:     Head: Normocephalic and atraumatic.  Eyes:     General:        Right eye: No discharge.        Left eye: No discharge.  Cardiovascular:     Rate and Rhythm: Normal rate and regular rhythm.     Heart sounds: No murmur heard.   No friction rub. No gallop.     Comments: Normal rate and rhythm when patient is calm during my exam, frequent PVCs noted on initial EKG Pulmonary:     Effort: Pulmonary effort is normal.     Breath sounds: Normal breath sounds.  Abdominal:     General: Bowel sounds are normal.     Palpations: Abdomen is soft.  Skin:    General: Skin is warm and dry.     Capillary Refill: Capillary refill takes less than 2 seconds.  Neurological:     Mental Status: She is alert and oriented to person, place, and time.  Psychiatric:        Mood and Affect: Mood normal.        Behavior: Behavior normal.    ED Results / Procedures / Treatments   Labs (all labs ordered are listed, but only abnormal results are displayed) Labs Reviewed  BASIC METABOLIC PANEL - Abnormal; Notable for the following components:       Result Value   Glucose, Bld 127 (*)    BUN 25 (*)    All other components within normal limits  CBC - Abnormal; Notable for the following components:   Platelets 432 (*)    All other components within normal limits  TROPONIN I (HIGH SENSITIVITY)    EKG EKG Interpretation  Date/Time:  Monday July 11 2021 13:57:25 EDT Ventricular Rate:  102 PR Interval:  158 QRS Duration: 130 QT Interval:  356 QTC Calculation: 463 R Axis:   57 Text Interpretation: Sinus tachycardia with frequent Premature ventricular complexes Right bundle branch block Abnormal ECG Confirmed by Madalyn Rob [phone]) on 07/11/2021 2:16:19 PM  Radiology DG Chest 2 View  Result Date: 07/11/2021 CLINICAL DATA:  Chest pain EXAM: CHEST - 2 VIEW COMPARISON:  12/31/2018 FINDINGS: The heart size and mediastinal contours are within normal limits. Minimal bibasilar scarring or atelectasis. Lungs appear otherwise clear. No pleural effusion or pneumothorax. The visualized skeletal structures are unremarkable. IMPRESSION: Minimal bibasilar scarring or atelectasis. Otherwise no acute cardiopulmonary findings. Electronically Signed   By: Davina Poke D.O.   On: 07/11/2021 14:39    Procedures Procedures   Medications Ordered in ED Medications - No data to display  ED Course  I have reviewed the triage vital signs and the nursing notes.  Pertinent labs & imaging results that were available during my care of the patient were reviewed by me and considered in my medical decision making (see chart for details).    MDM Rules/Calculators/A&P                         I discussed this case with my attending physician who cosigned this note including patient's presenting symptoms, physical exam, and planned diagnostics and interventions. Attending physician stated agreement with plan or made changes to plan which were implemented.   Attending physician assessed patient at bedside.  Given the large differential diagnosis  for Carol Bolton, the decision making in this case is of high complexity.  After evaluating all of the data points in this case, the presentation of Carol Bolton is NOT consistent with Acute Coronary Syndrome (ACS) and/or myocardial ischemia, pulmonary embolism, aortic dissection; Borhaave's, significant arrythmia, pneumothorax, cardiac tamponade, or other emergent cardiopulmonary condition.  Further, the presentation of Carol Bolton is NOT consistent with pericarditis, myocarditis, cholecystitis, pancreatitis, mediastinitis, endocarditis, new valvular disease.  Additionally, the presentation of Carol Little Graysonis NOT consistent with flail chest, cardiac contusion, ARDS, or significant intra-thoracic or intra-abdominal bleeding.  Moreover, this presentation is NOT consistent with pneumonia, sepsis, or pyelonephritis.  The patient has a EKG significant for stable right bundle branch block.  Patient also has significant PVCs on initial presentation while she was tachycardic.  Significant reduction in PVCs once patient was more calm, resting heart rate.  Discussed that her chest discomfort, pain may be associated with these abnormal beats, however they do not represent any acute ischemic event in her heart.  Patient reports that she experienced a similar experience where she was having frequent chest discomfort and feeling of heart palpitations around 2 years ago, and resolved spontaneously in 3 weeks.  Discussed that if this does not resolve, worsens, or if patient begins to experience lightheadedness, or near syncope that she should follow-up for further evaluation.  Strict return and follow-up precautions have been given by me personally or by detailed written instruction given verbally by nursing staff using the teach back method to the patient/family/caregiver(s).  Data Reviewed/Counseling: I have reviewed the patient's vital signs, nursing notes, and other relevant  tests/information. I had a detailed discussion regarding the historical points, exam findings, and any diagnostic results supporting the discharge diagnosis. I also discussed the need for outpatient follow-up and the need to return to the ED if symptoms worsen or if there are any questions  or concerns that arise at home.  Final Clinical Impression(s) / ED Diagnoses Final diagnoses:  Palpitations  PVC's (premature ventricular contractions)    Rx / DC Orders ED Discharge Orders     None        Anselmo Pickler, PA-C 07/11/21 1559    Charlesetta Shanks, MD 07/21/21 1723

## 2021-07-11 NOTE — Telephone Encounter (Signed)
Spoke with pt and advised her to call 911 or go to the ED. Pt states that pain is midsternal and described as dull. Pt does report that she has nausea without vomiting and shortness of breath. Pt does not have ntg. Pt verbalized understanding and had no additional questions.

## 2021-07-11 NOTE — Telephone Encounter (Signed)
Pt c/o of Chest Pain: STAT if CP now or developed within 24 hours  1. Are you having CP right now? More of a pressure, not really pain  2. Are you experiencing any other symptoms (ex. SOB, nausea, vomiting, sweating)? Occasional SOB (pt has chronic cough and allergies which may contribute to the SOB), back pain, racing heart   3. How long have you been experiencing CP? Since Thursday  4. Is your CP continuous or coming and going? Comes and goes  5. Have you taken Nitroglycerin? No, but patient does take baby aspirin  ?

## 2021-07-11 NOTE — Discharge Instructions (Signed)
As we discussed you do not have any signs of pneumonia, heart attack or other worrisome cardiac or pulmonary causes of your chest discomfort today. You did have some PVCs as we discussed--some of your chest discomfort may come from these episodes of feeling that your heart has skipped a beat.   Please follow up with your PCP, cardiologist for further evaluation. If you begin to have lightheadedness or feeling like you may pass out with these episodes please return to the ED for further evaluation.

## 2021-08-01 DIAGNOSIS — I10 Essential (primary) hypertension: Secondary | ICD-10-CM | POA: Diagnosis not present

## 2021-08-01 DIAGNOSIS — R059 Cough, unspecified: Secondary | ICD-10-CM | POA: Diagnosis not present

## 2021-08-01 DIAGNOSIS — E782 Mixed hyperlipidemia: Secondary | ICD-10-CM | POA: Diagnosis not present

## 2021-08-01 DIAGNOSIS — E1169 Type 2 diabetes mellitus with other specified complication: Secondary | ICD-10-CM | POA: Diagnosis not present

## 2021-08-01 DIAGNOSIS — Z23 Encounter for immunization: Secondary | ICD-10-CM | POA: Diagnosis not present

## 2021-08-01 DIAGNOSIS — Z0000 Encounter for general adult medical examination without abnormal findings: Secondary | ICD-10-CM | POA: Diagnosis not present

## 2021-08-01 DIAGNOSIS — M8580 Other specified disorders of bone density and structure, unspecified site: Secondary | ICD-10-CM | POA: Diagnosis not present

## 2021-08-01 DIAGNOSIS — J309 Allergic rhinitis, unspecified: Secondary | ICD-10-CM | POA: Diagnosis not present

## 2021-08-04 DIAGNOSIS — Z85828 Personal history of other malignant neoplasm of skin: Secondary | ICD-10-CM | POA: Diagnosis not present

## 2021-08-04 DIAGNOSIS — L309 Dermatitis, unspecified: Secondary | ICD-10-CM | POA: Diagnosis not present

## 2021-08-04 DIAGNOSIS — D692 Other nonthrombocytopenic purpura: Secondary | ICD-10-CM | POA: Diagnosis not present

## 2021-09-16 DIAGNOSIS — N3001 Acute cystitis with hematuria: Secondary | ICD-10-CM | POA: Diagnosis not present

## 2021-09-16 DIAGNOSIS — R309 Painful micturition, unspecified: Secondary | ICD-10-CM | POA: Diagnosis not present

## 2021-10-05 DIAGNOSIS — M8580 Other specified disorders of bone density and structure, unspecified site: Secondary | ICD-10-CM | POA: Insufficient documentation

## 2021-10-05 DIAGNOSIS — J309 Allergic rhinitis, unspecified: Secondary | ICD-10-CM | POA: Insufficient documentation

## 2021-10-05 DIAGNOSIS — Z860100 Personal history of colon polyps, unspecified: Secondary | ICD-10-CM | POA: Insufficient documentation

## 2021-10-06 ENCOUNTER — Other Ambulatory Visit: Payer: Self-pay

## 2021-10-06 ENCOUNTER — Ambulatory Visit (INDEPENDENT_AMBULATORY_CARE_PROVIDER_SITE_OTHER): Payer: Medicare Other

## 2021-10-06 ENCOUNTER — Ambulatory Visit: Payer: Medicare Other | Admitting: Cardiology

## 2021-10-06 VITALS — BP 152/68 | HR 91 | Ht 61.5 in | Wt 160.0 lb

## 2021-10-06 DIAGNOSIS — E782 Mixed hyperlipidemia: Secondary | ICD-10-CM | POA: Diagnosis not present

## 2021-10-06 DIAGNOSIS — I10 Essential (primary) hypertension: Secondary | ICD-10-CM

## 2021-10-06 DIAGNOSIS — I454 Nonspecific intraventricular block: Secondary | ICD-10-CM

## 2021-10-06 DIAGNOSIS — R002 Palpitations: Secondary | ICD-10-CM

## 2021-10-06 DIAGNOSIS — R079 Chest pain, unspecified: Secondary | ICD-10-CM

## 2021-10-06 DIAGNOSIS — R072 Precordial pain: Secondary | ICD-10-CM

## 2021-10-06 MED ORDER — METOPROLOL TARTRATE 100 MG PO TABS: 100.0000 mg | ORAL_TABLET | Freq: Once | ORAL | 0 refills | Status: DC

## 2021-10-06 NOTE — Patient Instructions (Addendum)
Medication Instructions:  Your physician recommends that you continue on your current medications as directed. Please refer to the Current Medication list given to you today.  *If you need a refill on your cardiac medications before your next appointment, please call your pharmacy*   Lab Work: BMP 1 week prior to cardiac CT If you have labs (blood work) drawn today and your tests are completely normal, you will receive your results only by: [place] (if you have MyChart) OR A paper copy in the mail If you have any lab test that is abnormal or we need to change your treatment, we will call you to review the results.   Testing/Procedures: A zio monitor was ordered today. It will remain on for 14 days. You will then return monitor and event diary in provided box. It takes 1-2 weeks for report to be downloaded and returned to [place]. We will call you with the results. If monitor falls off or has orange flashing light, please call Zio for further instructions.   Your physician has requested that you have an echocardiogram. Echocardiography is a painless test that uses sound waves to create images of your heart. It provides your doctor with information about the size and shape of your heart and how well your hearts chambers and valves are working. This procedure takes approximately one hour. There are no restrictions for this procedure.     Your cardiac CT will be scheduled at one of the below locations:   [facility] [address] [place], [place] [address] [phone]  [place] [address] [place], [place] [address] [phone]  If scheduled at [facility], please arrive at the [facility] main entrance (entrance A) of [facility] 30 minutes prior to test start time. You can use the FREE valet parking offered at the main entrance (encouraged to control the heart rate for the test) Proceed to  the [facility] Radiology Department (first floor) to check-in and test prep.  If scheduled at [facility], please arrive 15 mins early for check-in and test prep.  Please follow these instructions carefully (unless otherwise directed):  On the Night Before the Test: Be sure to Drink plenty of water. Do not consume any caffeinated/decaffeinated beverages or chocolate 12 hours prior to your test. Do not take any antihistamines 12 hours prior to your test.  On the Day of the Test: Drink plenty of water until 1 hour prior to the test. Do not eat any food 4 hours prior to the test. You may take your regular medications prior to the test.  Take metoprolol (Lopressor) two hours prior to test. HOLD Valsartan/Hydrochlorothiazide morning of the test. FEMALES- please wear underwire-free bra if available, avoid dresses & tight clothing       After the Test: Drink plenty of water. After receiving IV contrast, you may experience a mild flushed feeling. This is normal. On occasion, you may experience a mild rash up to 24 hours after the test. This is not dangerous. If this occurs, you can take Benadryl 25 mg and increase your fluid intake. If you experience trouble breathing, this can be serious. If it is severe call 911 IMMEDIATELY. If it is mild, please call our office.   We will call to schedule your test 2-4 weeks out understanding that some insurance companies will need an authorization prior to the service being performed.   For non-scheduling related questions, please  contact the cardiac imaging nurse navigator should you have any questions/concerns: Marchia Bond, Cardiac Imaging Nurse Navigator Gordy Clement, Cardiac Imaging Nurse Navigator [facility] Direct Office Dial: [phone]   For scheduling needs, including cancellations and rescheduling, please call [place], [phone].    Follow-Up: At [facility], you and your  health needs are our priority.  As part of our continuing mission to provide you with exceptional heart care, we have created designated Provider Care Teams.  These Care Teams include your primary Cardiologist (physician) and Advanced Practice Providers (APPs -  Physician Assistants and Nurse Practitioners) who all work together to provide you with the care you need, when you need it.  We recommend signing up for the patient portal called "MyChart".  Sign up information is provided on this After Visit Summary.  MyChart is used to connect with patients for Virtual Visits (Telemedicine).  Patients are able to view lab/test results, encounter notes, upcoming appointments, etc.  Non-urgent messages can be sent to your provider as well.   To learn more about what you can do with MyChart, go to [email or web address].    Your next appointment:   5 month(s)  The format for your next appointment:   In Person  Provider:   Jenne Campus, MD    Other Instructions None

## 2021-10-06 NOTE — Progress Notes (Signed)
Cardiology Office Note:    Date:  10/06/2021   ID:  Carol Bolton, DOB 20-Feb-1941, MRN [id number]  PCP:  Mayra Neer, MD  Cardiologist:  Jenne Campus, MD    Referring MD: Mayra Neer, MD   Chief Complaint  Patient presents with   Follow-up  I have palpitations chest pain  History of Present Illness:    Carol Bolton is a 81 y.o. female with a past medical history significant for atypical chest pain, right bundle branch block, dyslipidemia, essential hypertension.  At that time in 2020 she had a stress test done which was negative.  She was referred back to [place] because in October she ended up going to the emergency room because of palpitations.  She felt her heart skipping, she did have some PVCs.  Interestingly also at that time she was complaining having tightness in the chest.  Luckily her biochemical markers was negative she was discharged home since that time she is doing well she denies have any chest pain tightness squeezing pressure burning chest.  Interestingly EKG today show a right bundle branch of sinus rhythm with PVCs.  She denies have any syncope or dizziness.  Not typical chest pain tightness squeezing pressure burning chest since the time of hospitalization.  Past Medical History:  Diagnosis Date   Anxiety    Atypical chest pain 05/15/2019   BBB (bundle branch block)    right bbb   Bell's palsy    Bronchiectasis 11/15/2011   CT chest 09/2011: Lower lobe infiltrates that I suspect has some degree of chronicity and associated with bronchiectasis Spirometry 2013: normal  Sputum CS 2013:  Strep pneumoniae.    Chronic sinusitis 11/15/2011   Sinus surgery 2013    Cough 03/13/2012   Diabetes mellitus    Diabetes mellitus ([place]) 11/14/2011   Hyperlipidemia    Hypertension    Neuromuscular disorder (HCC)    Synovial cyst of lumbar facet joint 06/24/2012    Past Surgical History:  Procedure Laterality Date   EXTERNAL EAR SURGERY     R ear. d/t bells  palsy   LUMBAR FUSION     TUBAL LIGATION      Current Medications: Current Meds  Medication Sig   aspirin 81 MG chewable tablet Chew 1 tablet by mouth daily.   Calcium Carbonate-Vit D-Min (CALCIUM 1200) 1200-1000 MG-UNIT CHEW Chew 1 tablet by mouth daily.   Cetirizine HCl (ZYRTEC ALLERGY) 10 MG CAPS Take 1 tablet by mouth as needed (allergies).   cholecalciferol (VITAMIN D) 1000 UNITS tablet Take 1,000 Units by mouth daily.   diphenhydrAMINE (BENADRYL) 25 MG tablet Take 25 mg by mouth at bedtime as needed for itching or allergies. For allergies   guaiFENesin (MUCINEX) 600 MG 12 hr tablet Take 60 mg by mouth as needed for cough or to loosen phlegm.   KRILL OIL PO Take 1 capsule by mouth daily. Unknown strength   Probiotic Product (PROBIOTIC DAILY PO) Take 1 tablet by mouth daily. Unknown strength   simvastatin (ZOCOR) 40 MG tablet Take 40 mg by mouth every evening.   valsartan-hydrochlorothiazide (DIOVAN-HCT) 320-25 MG tablet Take 1 tablet by mouth daily.     Allergies:   Patient has no known allergies.   Social History   Socioeconomic History   Marital status: Married    Spouse name: Not on file   Number of children: 2   Years of education: Not on file   Highest education level: Not on file  Occupational History  Occupation: retired.     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 ROS:   Please see the history of present illness.    All 14 point review of systems negative except as described per history of present illness  EKGs/Labs/Other Studies Reviewed:      Recent Labs: 07/11/2021: BUN 25; Creatinine, Ser 0.93; Hemoglobin 14.0; Platelets 432; Potassium 3.6; Sodium 137  Recent Lipid Panel No results found for: CHOL, TRIG, HDL, CHOLHDL, VLDL, LDLCALC, LDLDIRECT  Physical Exam:    VS:  BP (!) 152/68 (BP Location: Left Arm, Patient Position: Sitting)    Pulse 91    Ht 5' 1.5" (1.562 m)    Wt 160 lb (72.6 kg)    SpO2 93%    BMI 29.74 kg/m     Wt Readings from Last 3 Encounters:  10/06/21 160 lb (72.6 kg)  09/13/20 162 lb (73.5 kg)  06/12/19 162 lb 6.4 oz (73.7 kg)     GEN:  Well nourished, well developed in no acute distress HEENT: Normal NECK: No JVD; No carotid bruits LYMPHATICS: No lymphadenopathy CARDIAC: RRR, no murmurs, no rubs, no gallops RESPIRATORY:  Clear to auscultation without rales, wheezing or rhonchi  ABDOMEN: Soft, non-tender, non-distended MUSCULOSKELETAL:  No edema; No deformity  SKIN: Warm and dry LOWER EXTREMITIES: no swelling NEUROLOGIC:  Alert and oriented x 3 PSYCHIATRIC:  Normal affect   ASSESSMENT:    1. Mixed hyperlipidemia   2. Palpitations   3. Precordial chest pain   4. Primary hypertension   5. BBB (bundle branch block)    PLAN:    In order of problems listed above:  Precordial chest pain it would happen when she was having a lot of PVCs of course there is a possibility that the ischemia that she suffered from during the time of the pain provoke PVCs likely since the time of hospitalization there is no more symptoms but I think still we need to evaluate her for coronary artery disease I think the best test for her in this situation will be coronary CT angio.  In the meantime she will continue taking antiplatelet therapy as well as statin. Palpitation denies having any but EKG today  showed PVCs preventricular complexes I will schedule her to have Zio patch to see was the burden of PVCs.  As a part of stratification and evaluation of those PVCs I will schedule her to have echocardiogram to assess left ventricle ejection fraction. Essential hypertension blood pressure elevated today but she is excited about topic that we discussed. Dyslipidemia she is on simvastatin with LDL of 59 HDL 77 but this data is from [facility] from 08/01/2021   Medication Adjustments/Labs and Tests Ordered: Current medicines are reviewed at length with the patient today.  Concerns regarding medicines are outlined above.  No orders of the defined types were placed in this encounter.  Medication changes: No orders of the defined types were placed in this encounter.   Signed, Park Liter, MD, [facility] 10/06/2021 1:42 PM    [facility]

## 2021-10-13 ENCOUNTER — Telehealth (HOSPITAL_COMMUNITY): Payer: Self-pay | Admitting: *Deleted

## 2021-10-13 NOTE — Telephone Encounter (Signed)
Reaching out to patient to offer assistance regarding upcoming cardiac imaging study; pt verbalizes understanding of appt date/time, parking situation and where to check in, pre-test NPO status and medications ordered, and verified current allergies; name and call back number provided for further questions should they arise  Carol Clement RN Navigator Cardiac Imaging [facility] [phone] office [phone] cell  Patient to take 100mg  metoprolol tartrate two hours prior to cardiac CT scan.  She is aware to get blood work prior and will arrive at 11:30am for her noon scan.

## 2021-10-14 DIAGNOSIS — R002 Palpitations: Secondary | ICD-10-CM

## 2021-10-14 DIAGNOSIS — E782 Mixed hyperlipidemia: Secondary | ICD-10-CM | POA: Diagnosis not present

## 2021-10-14 DIAGNOSIS — I10 Essential (primary) hypertension: Secondary | ICD-10-CM | POA: Diagnosis not present

## 2021-10-14 DIAGNOSIS — R079 Chest pain, unspecified: Secondary | ICD-10-CM

## 2021-10-14 DIAGNOSIS — I454 Nonspecific intraventricular block: Secondary | ICD-10-CM

## 2021-10-14 DIAGNOSIS — R072 Precordial pain: Secondary | ICD-10-CM

## 2021-10-15 LAB — BASIC METABOLIC PANEL
BUN/Creatinine Ratio: 24 (ref 12–28)
BUN: 22 mg/dL (ref 8–27)
CO2: 25 mmol/L (ref 20–29)
Calcium: 9.4 mg/dL (ref 8.7–10.3)
Chloride: 102 mmol/L (ref 96–106)
Creatinine, Ser: 0.93 mg/dL (ref 0.57–1.00)
Glucose: 102 mg/dL — ABNORMAL HIGH (ref 70–99)
Potassium: 4.6 mmol/L (ref 3.5–5.2)
Sodium: 142 mmol/L (ref 134–144)
eGFR: 63 mL/min/{1.73_m2} (ref >59)

## 2021-10-17 ENCOUNTER — Encounter (HOSPITAL_COMMUNITY): Payer: Self-pay

## 2021-10-17 ENCOUNTER — Other Ambulatory Visit: Payer: Self-pay

## 2021-10-17 ENCOUNTER — Ambulatory Visit (HOSPITAL_COMMUNITY)
Admission: RE | Admit: 2021-10-17 | Discharge: 2021-10-17 | Disposition: A | Discharge status: Home / Self Care | Payer: Medicare Other | Source: Ambulatory Visit | Attending: Cardiology | Admitting: Cardiology

## 2021-10-17 DIAGNOSIS — R079 Chest pain, unspecified: Secondary | ICD-10-CM | POA: Diagnosis not present

## 2021-10-17 DIAGNOSIS — I2510 Atherosclerotic heart disease of native coronary artery without angina pectoris: Secondary | ICD-10-CM | POA: Diagnosis not present

## 2021-10-17 MED ORDER — METOPROLOL TARTRATE 5 MG/5ML IV SOLN
INTRAVENOUS | Status: AC
  Filled 2021-10-17: qty 5

## 2021-10-17 MED ORDER — IOHEXOL 350 MG/ML SOLN
100.0000 mL | Freq: Once | INTRAVENOUS | Status: AC
  Administered 2021-10-17: 95 mL via INTRAVENOUS

## 2021-10-17 MED ORDER — NITROGLYCERIN 0.4 MG SL SUBL
SUBLINGUAL_TABLET | SUBLINGUAL | Status: AC
  Administered 2021-10-17: 0.8 mg via SUBLINGUAL
  Filled 2021-10-17: qty 2

## 2021-10-17 MED ORDER — NITROGLYCERIN 0.4 MG SL SUBL: 0.8000 mg | SUBLINGUAL_TABLET | Freq: Once | SUBLINGUAL | Status: AC

## 2021-10-17 MED ORDER — METOPROLOL TARTRATE 5 MG/5ML IV SOLN: 10.0000 mg | Freq: Once | INTRAVENOUS | Status: DC

## 2021-10-20 ENCOUNTER — Telehealth: Payer: Self-pay | Admitting: Cardiology

## 2021-10-20 DIAGNOSIS — E782 Mixed hyperlipidemia: Secondary | ICD-10-CM | POA: Diagnosis not present

## 2021-10-20 DIAGNOSIS — R072 Precordial pain: Secondary | ICD-10-CM | POA: Diagnosis not present

## 2021-10-20 DIAGNOSIS — I10 Essential (primary) hypertension: Secondary | ICD-10-CM | POA: Diagnosis not present

## 2021-10-20 DIAGNOSIS — R002 Palpitations: Secondary | ICD-10-CM | POA: Diagnosis not present

## 2021-10-20 NOTE — Telephone Encounter (Signed)
irhythm reporting abnormal results

## 2021-10-20 NOTE — Telephone Encounter (Signed)
Spoke to [facility] just now and they were calling to report an abnormal zio result. The result was 2nd degree AV block mobitz 2 47 bpm and lasted 1.6 seconds. Occurred on 10/12/2021 at 1:08 am.

## 2021-10-24 ENCOUNTER — Other Ambulatory Visit: Payer: Self-pay

## 2021-10-24 ENCOUNTER — Ambulatory Visit (HOSPITAL_BASED_OUTPATIENT_CLINIC_OR_DEPARTMENT_OTHER)
Admission: RE | Admit: 2021-10-24 | Discharge: 2021-10-24 | Disposition: A | Discharge status: Home / Self Care | Payer: Medicare Other | Source: Ambulatory Visit | Attending: Cardiology | Admitting: Cardiology

## 2021-10-24 DIAGNOSIS — I454 Nonspecific intraventricular block: Secondary | ICD-10-CM | POA: Insufficient documentation

## 2021-10-24 DIAGNOSIS — R072 Precordial pain: Secondary | ICD-10-CM | POA: Diagnosis not present

## 2021-10-24 DIAGNOSIS — I10 Essential (primary) hypertension: Secondary | ICD-10-CM | POA: Insufficient documentation

## 2021-10-24 DIAGNOSIS — R079 Chest pain, unspecified: Secondary | ICD-10-CM | POA: Diagnosis not present

## 2021-10-24 DIAGNOSIS — E782 Mixed hyperlipidemia: Secondary | ICD-10-CM | POA: Diagnosis not present

## 2021-10-24 DIAGNOSIS — R002 Palpitations: Secondary | ICD-10-CM | POA: Diagnosis not present

## 2021-10-24 LAB — ECHOCARDIOGRAM COMPLETE
AR max vel: 1.6 cm2
AV Area VTI: 1.55 cm2
AV Area mean vel: 1.4 cm2
AV Mean grad: 9 mmHg
AV Peak grad: 15.5 mmHg
Ao pk vel: 1.97 m/s
Area-P 1/2: 2.1 cm2
S' Lateral: 3 cm

## 2021-10-24 NOTE — Progress Notes (Signed)
°  Echocardiogram 2D Echocardiogram has been performed.  Carol Bolton F 10/24/2021, 10:15 AM

## 2021-10-27 ENCOUNTER — Telehealth: Payer: Self-pay | Admitting: Cardiology

## 2021-10-27 NOTE — Telephone Encounter (Signed)
Patient informed of result.

## 2021-10-27 NOTE — Telephone Encounter (Signed)
Pt called back in returning [place] call.    Best number [phone]

## 2021-10-28 ENCOUNTER — Telehealth: Payer: Self-pay

## 2021-10-28 NOTE — Telephone Encounter (Signed)
-----   Message from Park Liter, MD sent at 10/28/2021 10:38 AM EST ----- Overall monitor showed only 1 episode of AV block but that happened in the middle of the night so would not worry about it.  Otherwise fairly benign monitor

## 2021-10-28 NOTE — Telephone Encounter (Signed)
Patient notified of results.

## 2021-12-01 DIAGNOSIS — L570 Actinic keratosis: Secondary | ICD-10-CM | POA: Diagnosis not present

## 2021-12-01 DIAGNOSIS — L400 Psoriasis vulgaris: Secondary | ICD-10-CM | POA: Diagnosis not present

## 2021-12-01 DIAGNOSIS — L820 Inflamed seborrheic keratosis: Secondary | ICD-10-CM | POA: Diagnosis not present

## 2021-12-01 DIAGNOSIS — D692 Other nonthrombocytopenic purpura: Secondary | ICD-10-CM | POA: Diagnosis not present

## 2021-12-01 DIAGNOSIS — Z85828 Personal history of other malignant neoplasm of skin: Secondary | ICD-10-CM | POA: Diagnosis not present

## 2021-12-01 DIAGNOSIS — L821 Other seborrheic keratosis: Secondary | ICD-10-CM | POA: Diagnosis not present

## 2021-12-01 DIAGNOSIS — L304 Erythema intertrigo: Secondary | ICD-10-CM | POA: Diagnosis not present

## 2021-12-01 DIAGNOSIS — L918 Other hypertrophic disorders of the skin: Secondary | ICD-10-CM | POA: Diagnosis not present

## 2022-03-27 ENCOUNTER — Encounter: Payer: Self-pay | Admitting: Cardiology

## 2022-03-27 ENCOUNTER — Ambulatory Visit: Payer: Medicare Other | Admitting: Cardiology

## 2022-03-27 VITALS — BP 162/80 | HR 83 | Ht 61.5 in | Wt 164.1 lb

## 2022-03-27 DIAGNOSIS — R002 Palpitations: Secondary | ICD-10-CM | POA: Diagnosis not present

## 2022-03-27 DIAGNOSIS — I10 Essential (primary) hypertension: Secondary | ICD-10-CM

## 2022-03-27 DIAGNOSIS — I454 Nonspecific intraventricular block: Secondary | ICD-10-CM

## 2022-03-27 DIAGNOSIS — E782 Mixed hyperlipidemia: Secondary | ICD-10-CM | POA: Diagnosis not present

## 2022-03-27 NOTE — Patient Instructions (Signed)

## 2022-03-27 NOTE — Progress Notes (Signed)
Cardiology Office Note:    Date:  03/27/2022   ID:  Carol Bolton [birth year], MRN [id number]  PCP:  Mayra Neer, MD  Cardiologist:  Jenne Campus, MD    Referring MD: Mayra Neer, MD   Chief Complaint  Patient presents with   Follow-up    History of Present Illness:    Carol Bolton is a 81 y.o. female  with a past medical history significant for atypical chest pain, right bundle branch block, dyslipidemia, essential hypertension.  At that time in 2020 she had a stress test done which was negative.  She was referred back to [place] because in October she ended up going to the emergency room because of palpitations.  She felt her heart skipping, she did have some PVCs.  Interestingly also at that time she was complaining having tightness in the chest.  Luckily her biochemical markers was negative she was discharged home since that time she is doing well she denies have any chest pain tightness squeezing pressure burning chest.  Interestingly EKG today show a right bundle branch of sinus rhythm with PVCs. She comes today to my office in follow-up overall doing very well.  Palpitations are very rare and far in between does not bother her much denies have any chest pain tried to be more active tightness squeezing pressure burning chest.  Past Medical History:  Diagnosis Date   Anxiety    Atypical chest pain 05/15/2019   BBB (bundle branch block)    right bbb   Bell's palsy    Bronchiectasis 11/15/2011   CT chest 09/2011: Lower lobe infiltrates that I suspect has some degree of chronicity and associated with bronchiectasis Spirometry 2013: normal  Sputum CS 2013:  Strep pneumoniae.    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 d/t bells palsy   LUMBAR FUSION     TUBAL LIGATION      Current Medications: Current Meds  Medication Sig   aspirin 81 MG chewable tablet Chew 1 tablet by mouth daily.   Calcium Carbonate-Vit D-Min (CALCIUM 1200) 1200-1000 MG-UNIT CHEW Chew 1 tablet by mouth daily.   Cetirizine HCl (ZYRTEC ALLERGY) 10 MG CAPS Take 1 tablet by mouth as needed (allergies).   cholecalciferol (VITAMIN D) 1000 UNITS tablet Take 1,000 Units by mouth daily.   guaiFENesin (MUCINEX) 600 MG 12 hr tablet Take 60 mg by mouth as needed for cough or to loosen phlegm.   KRILL OIL PO Take 1 capsule by mouth daily. Unknown strength   simvastatin (ZOCOR) 40 MG tablet Take 40 mg by mouth every evening.   valsartan-hydrochlorothiazide (DIOVAN-HCT) 320-25 MG tablet Take 1 tablet by mouth daily.   [DISCONTINUED] metoprolol tartrate (LOPRESSOR) 100 MG tablet Take 1 tablet (100 mg total) by mouth once for 1 dose. Please take 2 hours prior to your cardiac CT     Allergies:   Patient has no known allergies.   Social History   Socioeconomic History   Marital status: Married    Spouse name: Not on file   Number of children: 2   Years of education: Not on file   Highest education level:  Not on file  Occupational History   Occupation: retired.     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 ROS:   Please see the history of present illness.    All 14 point review of systems negative except as described per history of present illness  EKGs/Labs/Other Studies Reviewed:      Recent Labs: 07/11/2021: Hemoglobin 14.0; Platelets 432 10/14/2021: BUN 22; Creatinine, Ser 0.93; Potassium 4.6; Sodium 142  Recent Lipid Panel No results found for: "CHOL", "TRIG", "HDL", "CHOLHDL", "VLDL", "LDLCALC", "LDLDIRECT"  Physical Exam:    VS:  BP (!) 162/80 (BP Location: Left Arm, Patient Position: Sitting)   Pulse 83   Ht 5' 1.5" (1.562 m)   Wt 164 lb 1.9 oz (74.4 kg)   SpO2 97%   BMI 30.51 kg/m     Wt Readings from Last 3 Encounters:  03/27/22 164 lb 1.9 oz (74.4 kg)  10/06/21 160 lb (72.6 kg)  09/13/20 162 lb (73.5 kg)     GEN:  Well nourished, well developed in no acute distress HEENT: Normal NECK: No JVD; No carotid bruits LYMPHATICS: No lymphadenopathy CARDIAC: RRR, no murmurs, no rubs, no gallops RESPIRATORY:  Clear to auscultation without rales, wheezing or rhonchi  ABDOMEN: Soft, non-tender, non-distended MUSCULOSKELETAL:  No edema; No deformity  SKIN: Warm and dry LOWER EXTREMITIES: no swelling NEUROLOGIC:  Alert and oriented x 3 PSYCHIATRIC:  Normal affect   ASSESSMENT:    1. Primary hypertension   2. BBB (bundle branch block)   3. Mixed hyperlipidemia   4. Palpitations    PLAN:    In order of problems listed above:  Essential hypertension blood pressure well controlled continue present management. I did review all test with her.  Coronary CT angio done in January showed mild nonobstructive disease involving proximal and mid LAD only 25 to 49%.  Calcium score was 212.  He is risk factors modifications which are already doing.  She is on antiplatelet therapy will continue. Dyslipidemia she is on Zocor 40 which I will continue.  I did review K PN which show me her LDL of 118.  HDL 51.  We will recheck her fasting  lipid profile today. Palpitations.  She did have only nonsustained supraventricular tachycardia overall she is very happy the way she is we will continue conservative approach   Medication Adjustments/Labs and Tests Ordered: Current medicines are reviewed at length with the patient today.  Concerns regarding medicines are outlined above.  No orders of the defined types were placed in this encounter.  Medication changes: No orders of the defined types were placed in this encounter.   Signed, Park Liter, MD, [facility] 03/27/2022 12:39 PM    [place]

## 2022-04-03 DIAGNOSIS — Z85828 Personal history of other malignant neoplasm of skin: Secondary | ICD-10-CM | POA: Diagnosis not present

## 2022-04-03 DIAGNOSIS — L304 Erythema intertrigo: Secondary | ICD-10-CM | POA: Diagnosis not present

## 2022-04-03 DIAGNOSIS — L400 Psoriasis vulgaris: Secondary | ICD-10-CM | POA: Diagnosis not present

## 2022-07-06 ENCOUNTER — Other Ambulatory Visit: Payer: Self-pay | Admitting: Family Medicine

## 2022-07-06 DIAGNOSIS — Z1231 Encounter for screening mammogram for malignant neoplasm of breast: Secondary | ICD-10-CM

## 2022-07-14 ENCOUNTER — Ambulatory Visit
Admission: RE | Admit: 2022-07-14 | Discharge: 2022-07-14 | Disposition: A | Discharge status: Home / Self Care | Payer: Medicare Other | Source: Ambulatory Visit | Attending: Family Medicine | Admitting: Family Medicine

## 2022-07-14 DIAGNOSIS — Z1231 Encounter for screening mammogram for malignant neoplasm of breast: Secondary | ICD-10-CM

## 2022-08-21 DIAGNOSIS — J309 Allergic rhinitis, unspecified: Secondary | ICD-10-CM | POA: Diagnosis not present

## 2022-08-21 DIAGNOSIS — Z0000 Encounter for general adult medical examination without abnormal findings: Secondary | ICD-10-CM | POA: Diagnosis not present

## 2022-08-21 DIAGNOSIS — I10 Essential (primary) hypertension: Secondary | ICD-10-CM | POA: Diagnosis not present

## 2022-08-21 DIAGNOSIS — R059 Cough, unspecified: Secondary | ICD-10-CM | POA: Diagnosis not present

## 2022-08-21 DIAGNOSIS — M8580 Other specified disorders of bone density and structure, unspecified site: Secondary | ICD-10-CM | POA: Diagnosis not present

## 2022-08-21 DIAGNOSIS — E782 Mixed hyperlipidemia: Secondary | ICD-10-CM | POA: Diagnosis not present

## 2022-08-21 DIAGNOSIS — E1169 Type 2 diabetes mellitus with other specified complication: Secondary | ICD-10-CM | POA: Diagnosis not present

## 2022-08-21 DIAGNOSIS — Z23 Encounter for immunization: Secondary | ICD-10-CM | POA: Diagnosis not present

## 2022-09-28 ENCOUNTER — Encounter: Payer: Self-pay | Admitting: Cardiology

## 2022-09-28 ENCOUNTER — Ambulatory Visit: Payer: Medicare Other | Attending: Cardiology | Admitting: Cardiology

## 2022-09-28 VITALS — BP 188/90 | HR 102 | Ht 61.5 in | Wt 163.0 lb

## 2022-09-28 DIAGNOSIS — E782 Mixed hyperlipidemia: Secondary | ICD-10-CM

## 2022-09-28 DIAGNOSIS — R002 Palpitations: Secondary | ICD-10-CM

## 2022-09-28 DIAGNOSIS — I454 Nonspecific intraventricular block: Secondary | ICD-10-CM | POA: Diagnosis not present

## 2022-09-28 DIAGNOSIS — I2510 Atherosclerotic heart disease of native coronary artery without angina pectoris: Secondary | ICD-10-CM | POA: Diagnosis not present

## 2022-09-28 DIAGNOSIS — R0789 Other chest pain: Secondary | ICD-10-CM | POA: Diagnosis not present

## 2022-09-28 NOTE — Patient Instructions (Signed)
Medication Instructions:  Your physician recommends that you continue on your current medications as directed. Please refer to the Current Medication list given to you today.  *If you need a refill on your cardiac medications before your next appointment, please call your pharmacy*   Lab Work: none If you have labs (blood work) drawn today and your tests are completely normal, you will receive your results only by: [place] (if you have MyChart) OR A paper copy in the mail If you have any lab test that is abnormal or we need to change your treatment, we will call you to review the results.   Testing/Procedures: none   Follow-Up: At [facility], you and your health needs are our priority.  As part of our continuing mission to provide you with exceptional heart care, we have created designated Provider Care Teams.  These Care Teams include your primary Cardiologist (physician) and Advanced Practice Providers (APPs -  Physician Assistants and Nurse Practitioners) who all work together to provide you with the care you need, when you need it.  We recommend signing up for the patient portal called "MyChart".  Sign up information is provided on this After Visit Summary.  MyChart is used to connect with patients for Virtual Visits (Telemedicine).  Patients are able to view lab/test results, encounter notes, upcoming appointments, etc.  Non-urgent messages can be sent to your provider as well.   To learn more about what you can do with MyChart, go to [email or web address].    Your next appointment:   1 year(s)  Provider:   Jenne Campus, MD    Other Instructions

## 2022-09-28 NOTE — Progress Notes (Signed)
Cardiology Office Note:    Date:  09/28/2022   ID:  Carol Bolton, DOB 05/06/1941, MRN [id number]  PCP:  Mayra Neer, MD  Cardiologist:  Jenne Campus, MD    Referring MD: Mayra Neer, MD   Chief Complaint  Patient presents with   Follow-up  Doing well  History of Present Illness:    Carol Bolton is a 82 y.o. female with past medical history significant for atypical chest pain, coronary CT angio done 2022 showed up to 49% stenosis of the proximal and mid LAD otherwise nonobstructive, right bundle branch block, dyslipidemia, essential hypertension, bronchiectasis. She comes to my office for follow-up overall doing well.  She does have some palpitation but those are rare and very short lasting denies have any chest pain tightness squeezing pressure burning chest.  She does what she wants to do and she is very happy where she is  Past Medical History:  Diagnosis Date   Anxiety    Atypical chest pain 05/15/2019   BBB (bundle branch block)    right bbb   Bell's palsy    Bronchiectasis 11/15/2011   CT chest 09/2011: Lower lobe infiltrates that I suspect has some degree of chronicity and associated with bronchiectasis Spirometry 2013: normal  Sputum CS 2013:  Strep pneumoniae.    Chronic sinusitis 11/15/2011   Sinus surgery 2013    Cough 03/13/2012   Diabetes mellitus    Diabetes mellitus ([place]) 11/14/2011   Hyperlipidemia    Hypertension    Neuromuscular disorder (HCC)    Synovial cyst of lumbar facet joint 06/24/2012    Past Surgical History:  Procedure Laterality Date   EXTERNAL EAR SURGERY     R ear. d/t bells palsy   LUMBAR FUSION     TUBAL LIGATION      Current Medications: Current Meds  Medication Sig   aspirin 81 MG chewable tablet Chew 1 tablet by mouth daily.   Calcium Carbonate-Vit D-Min (CALCIUM 1200) 1200-1000 MG-UNIT CHEW Chew 1 tablet by mouth daily.   Cetirizine HCl (ZYRTEC ALLERGY) 10 MG CAPS Take 1 tablet by mouth as needed (allergies).    cholecalciferol (VITAMIN D) 1000 UNITS tablet Take 1,000 Units by mouth daily.   diphenhydrAMINE (BENADRYL) 25 MG tablet Take 25 mg by mouth at bedtime as needed for itching or allergies. For allergies   guaiFENesin (MUCINEX) 600 MG 12 hr tablet Take 60 mg by mouth as needed for cough or to loosen phlegm.   ibandronate (BONIVA) 150 MG tablet Take 150 mg by mouth daily.   KRILL OIL PO Take 1 capsule by mouth daily. Unknown strength   Probiotic Product (PROBIOTIC DAILY PO) Take 1 tablet by mouth daily. Unknown strength   simvastatin (ZOCOR) 40 MG tablet Take 40 mg by mouth every evening.   valsartan-hydrochlorothiazide (DIOVAN-HCT) 320-25 MG tablet Take 1 tablet by mouth daily.     Allergies:   Patient has no known allergies.   Social History   Socioeconomic History   Marital status: Married    Spouse name: Not on file   Number of children: 2   Years of education: Not on file   Highest education level: Not on file  Occupational History   Occupation: retired.     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 ROS:   Please see the history of present illness.    All 14 point review of systems negative except as described per history of present illness  EKGs/Labs/Other Studies Reviewed:      Recent Labs: 10/14/2021: BUN 22; Creatinine, Ser 0.93; Potassium 4.6;  Sodium 142  Recent Lipid Panel No results found for: "CHOL", "TRIG", "HDL", "CHOLHDL", "VLDL", "LDLCALC", "LDLDIRECT"  Physical Exam:    VS:  BP (!) 188/90 (BP Location: Left Arm, Patient Position: Sitting)   Pulse (!) 102   Ht 5' 1.5" (1.562 m)   Wt 163 lb (73.9 kg)   SpO2 91%   BMI 30.30 kg/m     Wt Readings from Last 3 Encounters:  09/28/22 163 lb (73.9 kg)  03/27/22 164 lb 1.9 oz (74.4 kg)  10/06/21 160 lb (72.6 kg)     GEN:  Well nourished, well developed in no acute distress HEENT: Normal NECK: No JVD; No carotid bruits LYMPHATICS: No lymphadenopathy CARDIAC: RRR, no murmurs, no rubs, no gallops RESPIRATORY:  Clear to auscultation without rales, wheezing or rhonchi  ABDOMEN: Soft, non-tender, non-distended MUSCULOSKELETAL:  No edema; No deformity  SKIN: Warm and dry LOWER EXTREMITIES: no swelling NEUROLOGIC:  Alert and oriented x 3 PSYCHIATRIC:  Normal affect   ASSESSMENT:    1. BBB (bundle branch block)   2. Coronary artery disease involving native coronary artery of native heart without angina pectoris   3. Atypical chest pain   4. Mixed hyperlipidemia   5. Palpitations    PLAN:    In order of problems listed above:  Coronary disease, only mild based on coronary CT angio, continue risk factors modifications and continue antiplatelets therapy asymptomatic. Right bundle branch block, chronic problem noted. Atypical chest pain denies having any, Dyslipidemia I did review K PN which show me her LDL 55 HDL 80 she is taking Zocor 40 which I will continue.   Medication Adjustments/Labs and Tests Ordered: Current medicines are reviewed at length with the patient today.  Concerns regarding medicines are outlined above.  Orders Placed This Encounter  Procedures   EKG 12-Lead   Medication changes: No orders of the defined types were placed in this encounter.   Signed, Park Liter, MD, [facility] 09/28/2022 11:40 AM    [place]

## 2023-02-05 DIAGNOSIS — D225 Melanocytic nevi of trunk: Secondary | ICD-10-CM | POA: Diagnosis not present

## 2023-02-05 DIAGNOSIS — D2261 Melanocytic nevi of right upper limb, including shoulder: Secondary | ICD-10-CM | POA: Diagnosis not present

## 2023-02-05 DIAGNOSIS — D2372 Other benign neoplasm of skin of left lower limb, including hip: Secondary | ICD-10-CM | POA: Diagnosis not present

## 2023-02-05 DIAGNOSIS — L821 Other seborrheic keratosis: Secondary | ICD-10-CM | POA: Diagnosis not present

## 2023-02-05 DIAGNOSIS — D2272 Melanocytic nevi of left lower limb, including hip: Secondary | ICD-10-CM | POA: Diagnosis not present

## 2023-02-05 DIAGNOSIS — D1801 Hemangioma of skin and subcutaneous tissue: Secondary | ICD-10-CM | POA: Diagnosis not present

## 2023-02-05 DIAGNOSIS — L404 Guttate psoriasis: Secondary | ICD-10-CM | POA: Diagnosis not present

## 2023-02-05 DIAGNOSIS — Z85828 Personal history of other malignant neoplasm of skin: Secondary | ICD-10-CM | POA: Diagnosis not present

## 2023-06-19 ENCOUNTER — Other Ambulatory Visit: Payer: Self-pay | Admitting: Family Medicine

## 2023-06-19 DIAGNOSIS — Z1231 Encounter for screening mammogram for malignant neoplasm of breast: Secondary | ICD-10-CM

## 2023-07-20 ENCOUNTER — Ambulatory Visit
Admission: RE | Admit: 2023-07-20 | Discharge: 2023-07-20 | Disposition: A | Discharge status: Home / Self Care | Payer: Medicare Other | Source: Ambulatory Visit | Attending: Family Medicine | Admitting: Family Medicine

## 2023-07-20 DIAGNOSIS — Z1231 Encounter for screening mammogram for malignant neoplasm of breast: Secondary | ICD-10-CM | POA: Diagnosis not present

## 2023-07-31 DIAGNOSIS — E119 Type 2 diabetes mellitus without complications: Secondary | ICD-10-CM | POA: Diagnosis not present

## 2023-08-28 DIAGNOSIS — M8580 Other specified disorders of bone density and structure, unspecified site: Secondary | ICD-10-CM | POA: Diagnosis not present

## 2023-08-28 DIAGNOSIS — Z23 Encounter for immunization: Secondary | ICD-10-CM | POA: Diagnosis not present

## 2023-08-28 DIAGNOSIS — I10 Essential (primary) hypertension: Secondary | ICD-10-CM | POA: Diagnosis not present

## 2023-08-28 DIAGNOSIS — E782 Mixed hyperlipidemia: Secondary | ICD-10-CM | POA: Diagnosis not present

## 2023-08-28 DIAGNOSIS — J309 Allergic rhinitis, unspecified: Secondary | ICD-10-CM | POA: Diagnosis not present

## 2023-08-28 DIAGNOSIS — R059 Cough, unspecified: Secondary | ICD-10-CM | POA: Diagnosis not present

## 2023-08-28 DIAGNOSIS — E1169 Type 2 diabetes mellitus with other specified complication: Secondary | ICD-10-CM | POA: Diagnosis not present

## 2023-08-28 DIAGNOSIS — I2510 Atherosclerotic heart disease of native coronary artery without angina pectoris: Secondary | ICD-10-CM | POA: Diagnosis not present

## 2023-08-28 DIAGNOSIS — I4510 Unspecified right bundle-branch block: Secondary | ICD-10-CM | POA: Diagnosis not present

## 2023-08-28 DIAGNOSIS — Z0000 Encounter for general adult medical examination without abnormal findings: Secondary | ICD-10-CM | POA: Diagnosis not present

## 2023-08-28 DIAGNOSIS — E119 Type 2 diabetes mellitus without complications: Secondary | ICD-10-CM | POA: Diagnosis not present

## 2023-08-29 ENCOUNTER — Other Ambulatory Visit: Payer: Self-pay | Admitting: Family Medicine

## 2023-08-29 DIAGNOSIS — M8580 Other specified disorders of bone density and structure, unspecified site: Secondary | ICD-10-CM

## 2023-09-20 ENCOUNTER — Ambulatory Visit
Admission: RE | Admit: 2023-09-20 | Discharge: 2023-09-20 | Disposition: A | Discharge status: Home / Self Care | Payer: Medicare Other | Source: Ambulatory Visit | Attending: Family Medicine | Admitting: Family Medicine

## 2023-09-20 DIAGNOSIS — M8580 Other specified disorders of bone density and structure, unspecified site: Secondary | ICD-10-CM

## 2023-09-20 DIAGNOSIS — M8588 Other specified disorders of bone density and structure, other site: Secondary | ICD-10-CM | POA: Diagnosis not present

## 2023-10-19 ENCOUNTER — Encounter: Payer: Self-pay | Admitting: Cardiology

## 2023-10-19 ENCOUNTER — Ambulatory Visit: Payer: Medicare Other | Attending: Cardiology | Admitting: Cardiology

## 2023-10-19 VITALS — BP 180/68 | HR 108 | Ht 61.5 in | Wt 159.0 lb

## 2023-10-19 DIAGNOSIS — R0789 Other chest pain: Secondary | ICD-10-CM | POA: Diagnosis not present

## 2023-10-19 DIAGNOSIS — I10 Essential (primary) hypertension: Secondary | ICD-10-CM

## 2023-10-19 DIAGNOSIS — I2510 Atherosclerotic heart disease of native coronary artery without angina pectoris: Secondary | ICD-10-CM | POA: Diagnosis not present

## 2023-10-19 DIAGNOSIS — I454 Nonspecific intraventricular block: Secondary | ICD-10-CM | POA: Diagnosis not present

## 2023-10-19 NOTE — Progress Notes (Signed)
Cardiology Office Note:    Date:  10/19/2023   ID:  Carol Bolton, DOB 1941/03/18, MRN [id number]  PCP:  Lupita Raider, MD  Cardiologist:  Gypsy Balsam, MD    Referring MD: Lupita Raider, MD   Chief Complaint  Patient presents with   Follow-up    History of Present Illness:    Carol Bolton is a 83 y.o. female past medical history significant for coronary artery disease she did have coronary CT angio in 2022 which showed up to 49% stenosis of the proximal and mid LAD otherwise nonobstructive, right bundle branch block, dyslipidemia, essential hypertension, bronchiectasis.  She comes today to months for follow-up overall she is doing well she is running around taking care of her grandchildren who are teenagers.  She drives them everywhere.  Denies have any chest pain tightness squeezing pressure burning chest.  She thinks she is doing quite well  Past Medical History:  Diagnosis Date   Anxiety    Atypical chest pain 05/15/2019   BBB (bundle branch block)    right bbb   Bell's palsy    Bronchiectasis 11/15/2011   CT chest 09/2011: Lower lobe infiltrates that I suspect has some degree of chronicity and associated with bronchiectasis Spirometry 2013: normal  Sputum CS 2013:  Strep pneumoniae.    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 d/t bells palsy   LUMBAR FUSION     TUBAL LIGATION      Current Medications: Current Meds  Medication Sig   aspirin 81 MG chewable tablet Chew 1 tablet by mouth daily.   Calcium Carbonate-Vit D-Min (CALCIUM 1200) 1200-1000 MG-UNIT CHEW Chew 1 tablet by mouth daily.   Cetirizine HCl (ZYRTEC ALLERGY) 10 MG CAPS Take 1 tablet by mouth as needed  (allergies).   cholecalciferol (VITAMIN D) 1000 UNITS tablet Take 1,000 Units by mouth daily.   diphenhydrAMINE (BENADRYL) 25 MG tablet Take 25 mg by mouth at bedtime as needed for itching or allergies. For allergies   guaiFENesin (MUCINEX) 600 MG 12 hr tablet Take 60 mg by mouth as needed for cough or to loosen phlegm.   ibandronate (BONIVA) 150 MG tablet Take 150 mg by mouth daily.   KRILL OIL PO Take 1 capsule by mouth daily. Unknown strength   Probiotic Product (PROBIOTIC DAILY PO) Take 1 tablet by mouth daily. Unknown strength   simvastatin (ZOCOR) 40 MG tablet Take 40 mg by mouth every evening.   valsartan-hydrochlorothiazide (DIOVAN-HCT) 320-25 MG tablet Take 1 tablet by mouth daily.     Allergies:   Patient has no known allergies.   Social History   Socioeconomic History   Marital status: Married    Spouse name: Not on file   Number of children: 2   Years of education: Not on file   Highest education level: Not on file  Occupational History   Occupation: retired.     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 ROS:   Please see the history of present illness.    All 14 point review of systems negative except as described per history of present illness  EKGs/Labs/Other Studies Reviewed:    EKG Interpretation Date/Time:  Friday October 19 2023 14:23:38  EST Ventricular Rate:  105 PR Interval:  156 QRS Duration:  124 QT Interval:  350 QTC Calculation: 462 R Axis:   76  Text Interpretation: Sinus tachycardia Right bundle branch block When compared with ECG of 11-Jul-2021 13:57, Premature ventricular complexes are no longer Present ST no longer depressed in Anterior leads Confirmed by Gypsy Balsam [phone]) on 10/19/2023 2:27:38 PM    Recent Labs: No results found for requested labs within last 365 days.  Recent Lipid Panel No results found for: "CHOL", "TRIG", "HDL", "CHOLHDL", "VLDL", "LDLCALC", "LDLDIRECT"  Physical Exam:    VS:  BP (!) 180/68 (BP Location: Right Arm, Patient Position: Sitting)   Pulse (!) 108   Ht 5' 1.5" (1.562 m)   Wt 159 lb (72.1 kg)   SpO2 99%   BMI 29.56 kg/m     Wt Readings from Last 3 Encounters:  10/19/23 159 lb (72.1 kg)  09/28/22 163 lb (73.9 kg)  03/27/22 164 lb 1.9 oz (74.4 kg)     GEN:  Well nourished, well developed in no acute distress HEENT: Normal NECK: No JVD; No carotid bruits LYMPHATICS: No lymphadenopathy CARDIAC: RRR, no murmurs, no rubs, no gallops RESPIRATORY:  Clear to auscultation without rales, wheezing or rhonchi  ABDOMEN: Soft, non-tender, non-distended MUSCULOSKELETAL:  No edema; No deformity  SKIN: Warm and dry LOWER EXTREMITIES: no swelling NEUROLOGIC:  Alert and oriented x 3 PSYCHIATRIC:  Normal affect   ASSESSMENT:    1. Primary hypertension   2. Coronary artery disease involving native coronary artery of native heart without angina pectoris   3. BBB (bundle branch block)   4. Atypical chest pain    PLAN:    In order of problems listed above:  Coronary disease stable denies have any signs and symptoms of reactivation of the problem we will continue present management. Dyslipidemia I did review K PN which show me her LDL of 72 HDL 77 she is on Zocor 40 which I will continue. Right bundle branch block chronic related to her chronic lung condition  stable.   Medication Adjustments/Labs and Tests Ordered: Current medicines are reviewed at length with the patient today.  Concerns regarding medicines are outlined above.  Orders Placed This Encounter  Procedures   EKG 12-Lead   Medication changes: No orders of the defined types were placed in this encounter.   Signed, Georgeanna Lea, MD, [facility] 10/19/2023 2:37 PM    [facility]

## 2023-10-19 NOTE — Patient Instructions (Signed)
 Medication Instructions:  Your physician recommends that you continue on your current medications as directed. Please refer to the Current Medication list given to you today.  *If you need a refill on your cardiac medications before your next appointment, please call your pharmacy*   Lab Work: None Ordered If you have labs (blood work) drawn today and your tests are completely normal, you will receive your results only by: MyChart Message (if you have MyChart) OR A paper copy in the mail If you have any lab test that is abnormal or we need to change your treatment, we will call you to review the results.   Testing/Procedures: None Ordered   Follow-Up: At [facility], you and your health needs are our priority.  As part of our continuing mission to provide you with exceptional heart care, we have created designated Provider Care Teams.  These Care Teams include your primary Cardiologist (physician) and Advanced Practice Providers (APPs -  Physician Assistants and Nurse Practitioners) who all work together to provide you with the care you need, when you need it.  We recommend signing up for the patient portal called "MyChart".  Sign up information is provided on this After Visit Summary.  MyChart is used to connect with patients for Virtual Visits (Telemedicine).  Patients are able to view lab/test results, encounter notes, upcoming appointments, etc.  Non-urgent messages can be sent to your provider as well.   To learn more about what you can do with MyChart, go to [email or web address].    Your next appointment:   12 month(s)  The format for your next appointment:   In Person  Provider:   Gypsy Balsam, MD    Other Instructions NA

## 2023-10-28 DIAGNOSIS — J209 Acute bronchitis, unspecified: Secondary | ICD-10-CM | POA: Diagnosis not present

## 2023-10-28 DIAGNOSIS — J019 Acute sinusitis, unspecified: Secondary | ICD-10-CM | POA: Diagnosis not present

## 2023-10-28 DIAGNOSIS — R059 Cough, unspecified: Secondary | ICD-10-CM | POA: Diagnosis not present

## 2023-10-28 DIAGNOSIS — R509 Fever, unspecified: Secondary | ICD-10-CM | POA: Diagnosis not present

## 2023-12-29 DIAGNOSIS — J069 Acute upper respiratory infection, unspecified: Secondary | ICD-10-CM | POA: Diagnosis not present

## 2023-12-29 DIAGNOSIS — R051 Acute cough: Secondary | ICD-10-CM | POA: Diagnosis not present

## 2024-01-14 DIAGNOSIS — M546 Pain in thoracic spine: Secondary | ICD-10-CM | POA: Diagnosis not present

## 2024-02-05 DIAGNOSIS — M5412 Radiculopathy, cervical region: Secondary | ICD-10-CM | POA: Diagnosis not present

## 2024-02-07 DIAGNOSIS — M5412 Radiculopathy, cervical region: Secondary | ICD-10-CM | POA: Diagnosis not present

## 2024-02-15 DIAGNOSIS — M5412 Radiculopathy, cervical region: Secondary | ICD-10-CM | POA: Diagnosis not present

## 2024-02-20 DIAGNOSIS — M5412 Radiculopathy, cervical region: Secondary | ICD-10-CM | POA: Diagnosis not present

## 2024-02-22 DIAGNOSIS — M5412 Radiculopathy, cervical region: Secondary | ICD-10-CM | POA: Diagnosis not present

## 2024-02-25 DIAGNOSIS — M5412 Radiculopathy, cervical region: Secondary | ICD-10-CM | POA: Diagnosis not present

## 2024-02-28 DIAGNOSIS — M5412 Radiculopathy, cervical region: Secondary | ICD-10-CM | POA: Diagnosis not present

## 2024-03-04 DIAGNOSIS — M5412 Radiculopathy, cervical region: Secondary | ICD-10-CM | POA: Diagnosis not present

## 2024-03-20 DIAGNOSIS — M5412 Radiculopathy, cervical region: Secondary | ICD-10-CM | POA: Diagnosis not present

## 2024-03-24 DIAGNOSIS — M5412 Radiculopathy, cervical region: Secondary | ICD-10-CM | POA: Diagnosis not present

## 2024-04-03 DIAGNOSIS — M5412 Radiculopathy, cervical region: Secondary | ICD-10-CM | POA: Diagnosis not present

## 2024-05-16 DIAGNOSIS — N390 Urinary tract infection, site not specified: Secondary | ICD-10-CM | POA: Diagnosis not present

## 2024-06-18 ENCOUNTER — Other Ambulatory Visit: Payer: Self-pay | Admitting: Family Medicine

## 2024-06-18 DIAGNOSIS — Z0000 Encounter for general adult medical examination without abnormal findings: Secondary | ICD-10-CM

## 2024-07-21 ENCOUNTER — Ambulatory Visit
Admission: RE | Admit: 2024-07-21 | Discharge: 2024-07-21 | Disposition: A | Source: Ambulatory Visit | Attending: Family Medicine | Admitting: Family Medicine

## 2024-07-21 DIAGNOSIS — Z0000 Encounter for general adult medical examination without abnormal findings: Secondary | ICD-10-CM

## 2024-07-23 NOTE — Progress Notes (Signed)
 Majorie Santee                                          MRN: [id number]   07/23/2024   The VBCI Quality Team Specialist reviewed this patient medical record for the purposes of chart review for care gap closure. The following were reviewed: chart review for care gap closure-kidney health evaluation for diabetes:uACR.    VBCI Quality Team
